# Patient Record
Sex: Male | Born: 1935
Health system: Southern US, Community
[De-identification: ages and names within clinical notes are randomized; demographics above are authoritative.]

## PROBLEM LIST (undated history)

## (undated) DIAGNOSIS — I10 Essential (primary) hypertension: Secondary | ICD-10-CM

## (undated) DIAGNOSIS — K5790 Diverticulosis of intestine, part unspecified, without perforation or abscess without bleeding: Secondary | ICD-10-CM

## (undated) DIAGNOSIS — K30 Functional dyspepsia: Secondary | ICD-10-CM

## (undated) DIAGNOSIS — Z720 Tobacco use: Secondary | ICD-10-CM

## (undated) DIAGNOSIS — E785 Hyperlipidemia, unspecified: Secondary | ICD-10-CM

## (undated) HISTORY — DX: Hyperlipidemia, unspecified: E78.5

## (undated) HISTORY — DX: Tobacco use: Z72.0

## (undated) HISTORY — DX: Essential (primary) hypertension: I10

## (undated) HISTORY — PX: VASECTOMY: SHX75

---

## 1998-09-06 HISTORY — PX: FRACTURE SURGERY: SHX138

## 1998-11-01 ENCOUNTER — Observation Stay (HOSPITAL_COMMUNITY): Admission: EM | Admit: 1998-11-01 | Discharge: 1998-11-01 | Payer: Self-pay | Admitting: Emergency Medicine

## 1998-11-01 ENCOUNTER — Encounter: Payer: Self-pay | Admitting: Emergency Medicine

## 1998-11-14 ENCOUNTER — Ambulatory Visit (HOSPITAL_COMMUNITY): Admission: RE | Admit: 1998-11-14 | Discharge: 1998-11-14 | Payer: Self-pay | Admitting: Urology

## 1998-11-14 ENCOUNTER — Encounter: Payer: Self-pay | Admitting: Urology

## 1998-12-13 ENCOUNTER — Inpatient Hospital Stay (HOSPITAL_COMMUNITY): Admission: EM | Admit: 1998-12-13 | Discharge: 1998-12-22 | Payer: Self-pay | Admitting: Emergency Medicine

## 1998-12-13 ENCOUNTER — Encounter: Payer: Self-pay | Admitting: Emergency Medicine

## 1998-12-13 ENCOUNTER — Encounter: Payer: Self-pay | Admitting: Orthopedic Surgery

## 1998-12-15 ENCOUNTER — Encounter: Payer: Self-pay | Admitting: Orthopedic Surgery

## 1998-12-16 ENCOUNTER — Encounter: Payer: Self-pay | Admitting: Orthopedic Surgery

## 1998-12-17 ENCOUNTER — Encounter: Payer: Self-pay | Admitting: General Surgery

## 1998-12-18 ENCOUNTER — Encounter: Payer: Self-pay | Admitting: Orthopedic Surgery

## 1998-12-22 ENCOUNTER — Inpatient Hospital Stay (HOSPITAL_COMMUNITY)
Admission: RE | Admit: 1998-12-22 | Discharge: 1998-12-31 | Payer: Self-pay | Admitting: Physical Medicine and Rehabilitation

## 1998-12-23 ENCOUNTER — Encounter: Payer: Self-pay | Admitting: Physical Medicine and Rehabilitation

## 1998-12-29 ENCOUNTER — Encounter: Payer: Self-pay | Admitting: Physical Medicine and Rehabilitation

## 1999-03-11 ENCOUNTER — Inpatient Hospital Stay (HOSPITAL_COMMUNITY): Admission: RE | Admit: 1999-03-11 | Discharge: 1999-03-12 | Payer: Self-pay | Admitting: Orthopedic Surgery

## 1999-04-07 ENCOUNTER — Encounter: Admission: RE | Admit: 1999-04-07 | Discharge: 1999-06-19 | Payer: Self-pay | Admitting: Orthopedic Surgery

## 2003-10-25 ENCOUNTER — Other Ambulatory Visit: Payer: Self-pay

## 2008-09-20 ENCOUNTER — Ambulatory Visit: Payer: Self-pay | Admitting: Internal Medicine

## 2008-11-26 ENCOUNTER — Ambulatory Visit: Payer: Self-pay | Admitting: Physician Assistant

## 2009-06-13 ENCOUNTER — Ambulatory Visit: Payer: Self-pay | Admitting: Gastroenterology

## 2009-12-08 ENCOUNTER — Ambulatory Visit: Payer: Self-pay | Admitting: General Practice

## 2009-12-24 ENCOUNTER — Inpatient Hospital Stay: Payer: Self-pay | Admitting: General Practice

## 2011-09-15 DIAGNOSIS — E785 Hyperlipidemia, unspecified: Secondary | ICD-10-CM | POA: Diagnosis not present

## 2011-09-15 DIAGNOSIS — Z79899 Other long term (current) drug therapy: Secondary | ICD-10-CM | POA: Diagnosis not present

## 2011-09-22 DIAGNOSIS — E785 Hyperlipidemia, unspecified: Secondary | ICD-10-CM | POA: Diagnosis not present

## 2011-09-22 DIAGNOSIS — Z79899 Other long term (current) drug therapy: Secondary | ICD-10-CM | POA: Diagnosis not present

## 2011-09-22 DIAGNOSIS — I498 Other specified cardiac arrhythmias: Secondary | ICD-10-CM | POA: Diagnosis not present

## 2011-09-22 DIAGNOSIS — I10 Essential (primary) hypertension: Secondary | ICD-10-CM | POA: Diagnosis not present

## 2011-12-20 DIAGNOSIS — J209 Acute bronchitis, unspecified: Secondary | ICD-10-CM | POA: Diagnosis not present

## 2011-12-20 DIAGNOSIS — J019 Acute sinusitis, unspecified: Secondary | ICD-10-CM | POA: Diagnosis not present

## 2011-12-20 DIAGNOSIS — I10 Essential (primary) hypertension: Secondary | ICD-10-CM | POA: Diagnosis not present

## 2012-02-02 ENCOUNTER — Telehealth: Payer: Self-pay | Admitting: Internal Medicine

## 2012-02-02 NOTE — Telephone Encounter (Signed)
Patient request return call to discuss [facility], she can be reached at [phone].

## 2012-02-03 NOTE — Telephone Encounter (Signed)
See note from 5/30

## 2012-03-16 DIAGNOSIS — Z125 Encounter for screening for malignant neoplasm of prostate: Secondary | ICD-10-CM | POA: Diagnosis not present

## 2012-03-16 DIAGNOSIS — E785 Hyperlipidemia, unspecified: Secondary | ICD-10-CM | POA: Diagnosis not present

## 2012-03-16 DIAGNOSIS — Z79899 Other long term (current) drug therapy: Secondary | ICD-10-CM | POA: Diagnosis not present

## 2012-03-30 DIAGNOSIS — Z79899 Other long term (current) drug therapy: Secondary | ICD-10-CM | POA: Diagnosis not present

## 2012-03-30 DIAGNOSIS — I10 Essential (primary) hypertension: Secondary | ICD-10-CM | POA: Diagnosis not present

## 2012-03-30 DIAGNOSIS — E785 Hyperlipidemia, unspecified: Secondary | ICD-10-CM | POA: Diagnosis not present

## 2012-04-06 DIAGNOSIS — H353190 Nonexudative age-related macular degeneration, unspecified eye, stage unspecified: Secondary | ICD-10-CM | POA: Diagnosis not present

## 2012-04-19 DIAGNOSIS — H353190 Nonexudative age-related macular degeneration, unspecified eye, stage unspecified: Secondary | ICD-10-CM | POA: Diagnosis not present

## 2012-07-09 DIAGNOSIS — Z23 Encounter for immunization: Secondary | ICD-10-CM | POA: Diagnosis not present

## 2012-07-19 DIAGNOSIS — M549 Dorsalgia, unspecified: Secondary | ICD-10-CM | POA: Diagnosis not present

## 2012-07-19 DIAGNOSIS — M545 Low back pain: Secondary | ICD-10-CM | POA: Diagnosis not present

## 2012-07-28 DIAGNOSIS — M545 Low back pain: Secondary | ICD-10-CM | POA: Diagnosis not present

## 2012-07-28 DIAGNOSIS — IMO0002 Reserved for concepts with insufficient information to code with codable children: Secondary | ICD-10-CM | POA: Diagnosis not present

## 2012-07-28 DIAGNOSIS — I10 Essential (primary) hypertension: Secondary | ICD-10-CM | POA: Diagnosis not present

## 2012-09-19 DIAGNOSIS — E785 Hyperlipidemia, unspecified: Secondary | ICD-10-CM | POA: Diagnosis not present

## 2012-09-19 DIAGNOSIS — Z79899 Other long term (current) drug therapy: Secondary | ICD-10-CM | POA: Diagnosis not present

## 2012-09-26 DIAGNOSIS — E785 Hyperlipidemia, unspecified: Secondary | ICD-10-CM | POA: Diagnosis not present

## 2012-09-26 DIAGNOSIS — I10 Essential (primary) hypertension: Secondary | ICD-10-CM | POA: Diagnosis not present

## 2012-09-26 DIAGNOSIS — Z0000 Encounter for general adult medical examination without abnormal findings: Secondary | ICD-10-CM | POA: Diagnosis not present

## 2012-09-26 DIAGNOSIS — K219 Gastro-esophageal reflux disease without esophagitis: Secondary | ICD-10-CM | POA: Diagnosis not present

## 2012-10-27 DIAGNOSIS — I498 Other specified cardiac arrhythmias: Secondary | ICD-10-CM | POA: Diagnosis not present

## 2012-11-14 DIAGNOSIS — R0602 Shortness of breath: Secondary | ICD-10-CM | POA: Diagnosis not present

## 2012-11-14 DIAGNOSIS — R011 Cardiac murmur, unspecified: Secondary | ICD-10-CM | POA: Diagnosis not present

## 2012-11-21 DIAGNOSIS — I4891 Unspecified atrial fibrillation: Secondary | ICD-10-CM | POA: Diagnosis not present

## 2012-11-21 DIAGNOSIS — I495 Sick sinus syndrome: Secondary | ICD-10-CM | POA: Diagnosis not present

## 2013-03-07 DIAGNOSIS — I10 Essential (primary) hypertension: Secondary | ICD-10-CM | POA: Diagnosis not present

## 2013-03-07 DIAGNOSIS — R5381 Other malaise: Secondary | ICD-10-CM | POA: Diagnosis not present

## 2013-03-07 DIAGNOSIS — I4891 Unspecified atrial fibrillation: Secondary | ICD-10-CM | POA: Diagnosis not present

## 2013-03-07 DIAGNOSIS — I495 Sick sinus syndrome: Secondary | ICD-10-CM | POA: Diagnosis not present

## 2013-03-07 DIAGNOSIS — R5383 Other fatigue: Secondary | ICD-10-CM | POA: Diagnosis not present

## 2013-03-19 DIAGNOSIS — Z125 Encounter for screening for malignant neoplasm of prostate: Secondary | ICD-10-CM | POA: Diagnosis not present

## 2013-03-19 DIAGNOSIS — I10 Essential (primary) hypertension: Secondary | ICD-10-CM | POA: Diagnosis not present

## 2013-03-19 DIAGNOSIS — Z0000 Encounter for general adult medical examination without abnormal findings: Secondary | ICD-10-CM | POA: Diagnosis not present

## 2013-04-05 DIAGNOSIS — E785 Hyperlipidemia, unspecified: Secondary | ICD-10-CM | POA: Diagnosis not present

## 2013-04-05 DIAGNOSIS — I4891 Unspecified atrial fibrillation: Secondary | ICD-10-CM | POA: Diagnosis not present

## 2013-04-05 DIAGNOSIS — I10 Essential (primary) hypertension: Secondary | ICD-10-CM | POA: Diagnosis not present

## 2013-04-05 DIAGNOSIS — I498 Other specified cardiac arrhythmias: Secondary | ICD-10-CM | POA: Diagnosis not present

## 2013-06-10 DIAGNOSIS — Z23 Encounter for immunization: Secondary | ICD-10-CM | POA: Diagnosis not present

## 2013-10-04 DIAGNOSIS — I10 Essential (primary) hypertension: Secondary | ICD-10-CM | POA: Diagnosis not present

## 2013-10-04 DIAGNOSIS — Z0000 Encounter for general adult medical examination without abnormal findings: Secondary | ICD-10-CM | POA: Diagnosis not present

## 2013-10-04 DIAGNOSIS — E785 Hyperlipidemia, unspecified: Secondary | ICD-10-CM | POA: Diagnosis not present

## 2013-11-28 ENCOUNTER — Encounter: Payer: Self-pay | Admitting: Family Medicine

## 2013-12-13 ENCOUNTER — Ambulatory Visit (INDEPENDENT_AMBULATORY_CARE_PROVIDER_SITE_OTHER): Payer: Medicare Other | Admitting: Family Medicine

## 2013-12-13 ENCOUNTER — Encounter: Payer: Self-pay | Admitting: Family Medicine

## 2013-12-13 VITALS — BP 130/60 | HR 60 | Temp 97.0°F | Resp 16 | Ht 65.5 in | Wt 180.0 lb

## 2013-12-13 DIAGNOSIS — I4949 Other premature depolarization: Secondary | ICD-10-CM | POA: Diagnosis not present

## 2013-12-13 DIAGNOSIS — E785 Hyperlipidemia, unspecified: Secondary | ICD-10-CM

## 2013-12-13 DIAGNOSIS — I10 Essential (primary) hypertension: Secondary | ICD-10-CM

## 2013-12-13 DIAGNOSIS — Z720 Tobacco use: Secondary | ICD-10-CM | POA: Insufficient documentation

## 2013-12-13 DIAGNOSIS — I493 Ventricular premature depolarization: Secondary | ICD-10-CM

## 2013-12-13 NOTE — Progress Notes (Signed)
Subjective:    Patient ID: Stephen Alvarez, male    DOB: 1932/06/02, 80 y.o.   MRN: [id number]  HPI Patient is a very pleasant 80 year old white male who is here today to establish care. History of hypertension as well as hyperlipidemia. He denies any chest pain, shortness of breath, dyspnea on exertion. He denies any myalgias or right upper quadrant pain. He had lab work checked 2 months ago his previous PCP. I do not have a record of that today to review but I like to get that. His last colonoscopy was about 3 or 4 years ago. He is overdue for prostate cancer screening. He states that he has never had a pneumonia vaccine. He denies any other medical problems. Past Medical History  Diagnosis Date  . Hyperlipidemia   . Hypertension   . Chewing tobacco use    Current Outpatient Prescriptions on File Prior to Visit  Medication Sig Dispense Refill  . aspirin 81 MG tablet Take 81 mg by mouth daily.      . hydrochlorothiazide (MICROZIDE) 12.5 MG capsule Take 12.5 mg by mouth daily.      Marland Kitchen omeprazole (PRILOSEC) 20 MG capsule Take 20 mg by mouth daily.      . simvastatin (ZOCOR) 40 MG tablet Take 40 mg by mouth daily.       No current facility-administered medications on file prior to visit.   No Known Allergies History   Social History  . Marital Status: Married    Spouse Name: N/A    Number of Children: N/A  . Years of Education: N/A   Occupational History  . Not on file.   Social History Main Topics  . Smoking status: Never Smoker   . Smokeless tobacco: Current User    Types: Chew  . Alcohol Use: No  . Drug Use: No  . Sexual Activity: No   Other Topics Concern  . Not on file   Social History Narrative  . No narrative on file   Family History  Problem Relation Age of Onset  . Depression Brother   . Diabetes Brother   . Heart disease Brother   . Hyperlipidemia Daughter   . Hypertension Daughter       Review of Systems  All other systems reviewed and are  negative.      Objective:   Physical Exam  Vitals reviewed. Constitutional: He is oriented to person, place, and time. He appears well-developed and well-nourished. No distress.  HENT:  Mouth/Throat: Oropharynx is clear and moist.  Neck: Neck supple. No thyromegaly present.  Cardiovascular: Normal rate, regular rhythm and normal heart sounds.  Exam reveals no gallop and no friction rub.   No murmur heard. Pulmonary/Chest: Effort normal and breath sounds normal. No respiratory distress. He has no wheezes. He has no rales. He exhibits no tenderness.  Abdominal: Soft. Bowel sounds are normal. He exhibits no distension and no mass. There is no tenderness. There is no rebound and no guarding.  Musculoskeletal: He exhibits no edema.  Neurological: He is alert and oriented to person, place, and time. He has normal reflexes. He displays normal reflexes. No cranial nerve deficit. He exhibits normal muscle tone. Coordination normal.  Skin: Rash noted. He is not diaphoretic.  Psychiatric: He has a normal mood and affect. His behavior is normal. Judgment and thought content normal.    Patient has several precancerous on his left cheek. There is also an ulcer on his lower left lip. He states that it is  only been there for one week he states that it is a healing fever blister.      Assessment & Plan:  1. PVC (premature ventricular contraction) I auscultated a PVC today on examination. I spent possibly 5-10 minutes discussing her PVCs are and when we should treat them at work them up further 2. HTN (hypertension) Pressures well controlled. Continue current medications at their present dosages. I did recommend cessation of chewing tobacco.  3. HLD (hyperlipidemia) I like to review the patient's most recent lab work. His go LDL cholesterol be less than 130.  Patient received Prevnar 13 today in office. I didn't Pneumovax 23 at his next office visit. I like to lab work to determine if he has had a  PSA in the last year. I also recommended that he monitor the spots on his face and lip. If they do not resolve I recommend biopsy.

## 2013-12-26 ENCOUNTER — Other Ambulatory Visit: Payer: Self-pay | Admitting: Family Medicine

## 2013-12-26 MED ORDER — OMEPRAZOLE 20 MG PO CPDR: 20.0000 mg | DELAYED_RELEASE_CAPSULE | Freq: Every day | ORAL | Status: DC

## 2014-06-03 ENCOUNTER — Telehealth: Payer: Self-pay | Admitting: Family Medicine

## 2014-06-03 MED ORDER — SIMVASTATIN 40 MG PO TABS: 40.0000 mg | ORAL_TABLET | Freq: Every day | ORAL | Status: DC

## 2014-06-03 NOTE — Telephone Encounter (Signed)
Pt is needing refills on simvastatin (ZOCOR) 40 MG tablet [phone]  [facility] [address] [place], [place] - [address]

## 2014-06-03 NOTE — Telephone Encounter (Signed)
Med sent to pharm. Pt scheduled for CPE on 06/13/14.

## 2014-06-13 ENCOUNTER — Encounter: Payer: Medicare Other | Admitting: Family Medicine

## 2014-06-26 DIAGNOSIS — Z23 Encounter for immunization: Secondary | ICD-10-CM | POA: Diagnosis not present

## 2014-06-27 DIAGNOSIS — Z86010 Personal history of colonic polyps: Secondary | ICD-10-CM | POA: Diagnosis not present

## 2014-07-02 ENCOUNTER — Encounter: Payer: Self-pay | Admitting: Family Medicine

## 2014-07-02 ENCOUNTER — Ambulatory Visit (INDEPENDENT_AMBULATORY_CARE_PROVIDER_SITE_OTHER): Payer: Medicare Other | Admitting: Family Medicine

## 2014-07-02 VITALS — BP 130/70 | HR 64 | Temp 98.2°F | Resp 16 | Ht 65.5 in | Wt 182.0 lb

## 2014-07-02 DIAGNOSIS — Z0000 Encounter for general adult medical examination without abnormal findings: Secondary | ICD-10-CM | POA: Diagnosis not present

## 2014-07-02 DIAGNOSIS — Z23 Encounter for immunization: Secondary | ICD-10-CM

## 2014-07-02 NOTE — Progress Notes (Signed)
**Note Stephen-Identified via Obfuscation** Subjective:    Patient ID: Stephen Alvarez, male    DOB: Dec 29, 1931, 80 y.o.   MRN: [id number]  HPI Patient is a very pleasant 80 year old white male who is here today for complete physical exam.  He has no medical concerns. He recently saw gastroenterologist and they have both decided that colonoscopies are no longer necessary for this patient. He would still like to have a prostate exam to evaluate for prostate cancer. I explained to the patient that it is highly likely that he has a slow-growing prostate cancer simply as a function of his age. I also explained that he would be better off not to treat a very slow-growing cancer. After this discussion, the patient still requests a rectal exam and a PSA. His flu shot is up-to-date. He is due for Pneumovax 23. He is also overdue for fasting lab work. Past Medical History  Diagnosis Date  . Hyperlipidemia   . Hypertension   . Chewing tobacco use    Past Surgical History  Procedure Laterality Date  . Fracture surgery  2000    Hip, shoulder, knee  . Vasectomy     Current Outpatient Prescriptions on File Prior to Visit  Medication Sig Dispense Refill  . aspirin 81 MG tablet Take 81 mg by mouth daily.      . hydrochlorothiazide (MICROZIDE) 12.5 MG capsule Take 12.5 mg by mouth daily.      Marland Kitchen. omeprazole (PRILOSEC) 20 MG capsule Take 1 capsule (20 mg total) by mouth daily.  90 capsule  4  . simvastatin (ZOCOR) 40 MG tablet Take 1 tablet (40 mg total) by mouth daily.  30 tablet  3   No current facility-administered medications on file prior to visit.   No Known Allergies History   Social History  . Marital Status: Married    Spouse Name: N/A    Number of Children: N/A  . Years of Education: N/A   Occupational History  . Not on file.   Social History Main Topics  . Smoking status: Never Smoker   . Smokeless tobacco: Current User    Types: Chew  . Alcohol Use: No  . Drug Use: No  . Sexual Activity: No   Other Topics Concern  .  Not on file   Social History Narrative  . No narrative on file   Family History  Problem Relation Age of Onset  . Depression Brother   . Diabetes Brother   . Heart disease Brother   . Hyperlipidemia Daughter   . Hypertension Daughter       Review of Systems  All other systems reviewed and are negative.      Objective:   Physical Exam  Vitals reviewed. Constitutional: He is oriented to person, place, and time. He appears well-developed and well-nourished. No distress.  HENT:  Head: Normocephalic and atraumatic.  Right Ear: External ear normal.  Left Ear: External ear normal.  Nose: Nose normal.  Mouth/Throat: Oropharynx is clear and moist. No oropharyngeal exudate.  Eyes: Conjunctivae and EOM are normal. Pupils are equal, round, and reactive to light. Right eye exhibits no discharge. Left eye exhibits no discharge. No scleral icterus.  Neck: Normal range of motion. Neck supple. No JVD present. No tracheal deviation present. No thyromegaly present.  Cardiovascular: Normal rate, regular rhythm, normal heart sounds and intact distal pulses.  Exam reveals no gallop and no friction rub.   No murmur heard. Pulmonary/Chest: Effort normal and breath sounds normal. No stridor. No respiratory distress.  He has no wheezes. He has no rales. He exhibits no tenderness.  Abdominal: Soft. Bowel sounds are normal. He exhibits no distension and no mass. There is no tenderness. There is no rebound and no guarding.  Genitourinary: Rectum normal, prostate normal and penis normal. Guaiac negative stool. No penile tenderness.  Musculoskeletal: Normal range of motion. He exhibits no edema and no tenderness.  Lymphadenopathy:    He has no cervical adenopathy.  Neurological: He is alert and oriented to person, place, and time. He has normal reflexes. He displays normal reflexes. No cranial nerve deficit. He exhibits normal muscle tone. Coordination normal.  Skin: Skin is warm. No rash noted. He is not  diaphoretic. No erythema. No pallor.  Psychiatric: He has a normal mood and affect. His behavior is normal. Judgment and thought content normal.          Assessment & Plan:  Routine general medical examination at a health care facility Routine general medical examination at a health care facility - Plan: COMPLETE METABOLIC PANEL WITH GFR, Lipid panel, CBC with Differential, PSA, Medicare   Patient's physical exam today is completely normal. I have asked him to return fasting for a CMP, lipid panel, CBC, and PSA. The remainder of his preventative care is up-to-date. The patient was given Pneumovax today. He declined a tetanus vaccine.

## 2014-07-12 ENCOUNTER — Other Ambulatory Visit: Payer: Medicare Other

## 2014-07-12 DIAGNOSIS — Z0000 Encounter for general adult medical examination without abnormal findings: Secondary | ICD-10-CM | POA: Diagnosis not present

## 2014-07-12 LAB — CBC WITH DIFFERENTIAL/PLATELET
BASOS ABS: 0.1 10*3/uL (ref 0.0–0.1)
BASOS PCT: 1 % (ref 0–1)
EOS ABS: 0.3 10*3/uL (ref 0.0–0.7)
Eosinophils Relative: 5 % (ref 0–5)
HCT: 38.1 % — ABNORMAL LOW (ref 39.0–52.0)
HEMOGLOBIN: 12.7 g/dL — AB (ref 13.0–17.0)
Lymphocytes Relative: 31 % (ref 12–46)
Lymphs Abs: 2 10*3/uL (ref 0.7–4.0)
MCH: 28.3 pg (ref 26.0–34.0)
MCHC: 33.3 g/dL (ref 30.0–36.0)
MCV: 85 fL (ref 78.0–100.0)
MONOS PCT: 12 % (ref 3–12)
Monocytes Absolute: 0.8 10*3/uL (ref 0.1–1.0)
NEUTROS ABS: 3.4 10*3/uL (ref 1.7–7.7)
Neutrophils Relative %: 51 % (ref 43–77)
Platelets: 256 10*3/uL (ref 150–400)
RBC: 4.48 MIL/uL (ref 4.22–5.81)
RDW: 13.7 % (ref 11.5–15.5)
WBC: 6.6 10*3/uL (ref 4.0–10.5)

## 2014-07-13 LAB — COMPLETE METABOLIC PANEL WITH GFR
ALT: 13 U/L (ref 0–53)
AST: 19 U/L (ref 0–37)
Albumin: 3.6 g/dL (ref 3.5–5.2)
Alkaline Phosphatase: 48 U/L (ref 39–117)
BILIRUBIN TOTAL: 0.5 mg/dL (ref 0.2–1.2)
BUN: 9 mg/dL (ref 6–23)
CALCIUM: 9.2 mg/dL (ref 8.4–10.5)
CO2: 29 mEq/L (ref 19–32)
CREATININE: 0.86 mg/dL (ref 0.50–1.35)
Chloride: 103 mEq/L (ref 96–112)
GFR, Est African American: >89 mL/min
GFR, Est Non African American: 82 mL/min
Glucose, Bld: 82 mg/dL (ref 70–99)
Potassium: 4.3 mEq/L (ref 3.5–5.3)
Sodium: 140 mEq/L (ref 135–145)
Total Protein: 6.7 g/dL (ref 6.0–8.3)

## 2014-07-13 LAB — LIPID PANEL
CHOLESTEROL: 151 mg/dL (ref 0–200)
HDL: 56 mg/dL (ref >39)
LDL CALC: 73 mg/dL (ref 0–99)
TRIGLYCERIDES: 111 mg/dL (ref <150)
Total CHOL/HDL Ratio: 2.7 Ratio
VLDL: 22 mg/dL (ref 0–40)

## 2014-07-13 LAB — PSA, MEDICARE: PSA: 1.68 ng/mL (ref <=4.00)

## 2014-07-17 ENCOUNTER — Encounter: Payer: Self-pay | Admitting: Family Medicine

## 2014-08-28 ENCOUNTER — Telehealth: Payer: Self-pay | Admitting: Family Medicine

## 2014-08-28 MED ORDER — HYDROCHLOROTHIAZIDE 12.5 MG PO CAPS: 12.5000 mg | ORAL_CAPSULE | Freq: Every day | ORAL | Status: DC

## 2014-08-28 NOTE — Telephone Encounter (Signed)
[facility] [address]  [phone] [id number] Patient is calling for refill on hydrochlorothiazide (MICROZIDE) 12.5 MG capsule

## 2014-08-28 NOTE — Telephone Encounter (Signed)
Med sent to pharm 

## 2014-09-30 ENCOUNTER — Other Ambulatory Visit: Payer: Self-pay | Admitting: Family Medicine

## 2014-11-19 DIAGNOSIS — H2513 Age-related nuclear cataract, bilateral: Secondary | ICD-10-CM | POA: Diagnosis not present

## 2014-12-16 DIAGNOSIS — H3532 Exudative age-related macular degeneration: Secondary | ICD-10-CM | POA: Diagnosis not present

## 2014-12-16 DIAGNOSIS — H2513 Age-related nuclear cataract, bilateral: Secondary | ICD-10-CM | POA: Diagnosis not present

## 2014-12-19 DIAGNOSIS — H3532 Exudative age-related macular degeneration: Secondary | ICD-10-CM | POA: Diagnosis not present

## 2014-12-24 DIAGNOSIS — H3532 Exudative age-related macular degeneration: Secondary | ICD-10-CM | POA: Diagnosis not present

## 2015-01-30 ENCOUNTER — Other Ambulatory Visit: Payer: Self-pay | Admitting: Family Medicine

## 2015-01-30 DIAGNOSIS — H3532 Exudative age-related macular degeneration: Secondary | ICD-10-CM | POA: Diagnosis not present

## 2015-03-03 ENCOUNTER — Other Ambulatory Visit: Payer: Self-pay | Admitting: Family Medicine

## 2015-03-06 DIAGNOSIS — H3532 Exudative age-related macular degeneration: Secondary | ICD-10-CM | POA: Diagnosis not present

## 2015-03-25 ENCOUNTER — Ambulatory Visit: Payer: Medicare Other | Admitting: Family Medicine

## 2015-03-31 ENCOUNTER — Other Ambulatory Visit: Payer: Self-pay | Admitting: Family Medicine

## 2015-03-31 ENCOUNTER — Encounter: Payer: Self-pay | Admitting: Family Medicine

## 2015-03-31 ENCOUNTER — Ambulatory Visit (INDEPENDENT_AMBULATORY_CARE_PROVIDER_SITE_OTHER): Payer: Medicare Other | Admitting: Family Medicine

## 2015-03-31 VITALS — BP 138/60 | HR 66 | Temp 98.3°F | Resp 18 | Ht 65.5 in | Wt 176.0 lb

## 2015-03-31 DIAGNOSIS — E785 Hyperlipidemia, unspecified: Secondary | ICD-10-CM | POA: Diagnosis not present

## 2015-03-31 DIAGNOSIS — Z23 Encounter for immunization: Secondary | ICD-10-CM

## 2015-03-31 DIAGNOSIS — K219 Gastro-esophageal reflux disease without esophagitis: Secondary | ICD-10-CM

## 2015-03-31 DIAGNOSIS — I10 Essential (primary) hypertension: Secondary | ICD-10-CM

## 2015-03-31 MED ORDER — SIMVASTATIN 40 MG PO TABS: ORAL_TABLET | ORAL | Status: DC

## 2015-03-31 NOTE — Addendum Note (Signed)
Addended by: Legrand Rams B on: 03/31/2015 02:19 PM   Modules accepted: Orders

## 2015-03-31 NOTE — Progress Notes (Signed)
   Subjective:    Patient ID: Stephen Alvarez, male    DOB: [birth year], 81 y.o.   MRN: [id number]  HPI Patient is here for recheck of his blood pressure. He is currently on hydrochlorothiazide 12.5 mg by mouth daily. His blood pressure is well controlled at 138/60. He denies any chest pain shortness of breath or dyspnea on exertion. He is also taking simvastatin 40 mg a day for hyperlipidemia. He denies any myalgias or right upper quadrant pain. He is overdue for fasting lab work. He is also due for Prevnar 13. PSA is not due until November. Given his age he is not a candidate for colonoscopy Past Medical History  Diagnosis Date  . Hyperlipidemia   . Hypertension   . Chewing tobacco use    Past Surgical History  Procedure Laterality Date  . Fracture surgery  2000    Hip, shoulder, knee  . Vasectomy     Current Outpatient Prescriptions on File Prior to Visit  Medication Sig Dispense Refill  . aspirin 81 MG tablet Take 81 mg by mouth daily.    . hydrochlorothiazide (MICROZIDE) 12.5 MG capsule Take 1 capsule (12.5 mg total) by mouth daily. 30 capsule 11  . omeprazole (PRILOSEC) 20 MG capsule Take 1 capsule (20 mg total) by mouth daily. 90 capsule 4  . Psyllium (METAMUCIL) WAFR Take by mouth.    . simvastatin (ZOCOR) 40 MG tablet TAKE ONE TABLET BY MOUTH ONCE DAILY *NEEDS OFFICE VISIT AND LABS BEFORE FURTHER REFILLS* 30 tablet 0   No current facility-administered medications on file prior to visit.   No Known Allergies History   Social History  . Marital Status: Married    Spouse Name: N/A  . Number of Children: N/A  . Years of Education: N/A   Occupational History  . Not on file.   Social History Main Topics  . Smoking status: Never Smoker   . Smokeless tobacco: Current User    Types: Chew  . Alcohol Use: No  . Drug Use: No  . Sexual Activity: No   Other Topics Concern  . Not on file   Social History Narrative      Review of Systems  All other systems reviewed and  are negative.      Objective:   Physical Exam  Constitutional: He is oriented to person, place, and time.  Cardiovascular: Normal rate, regular rhythm and normal heart sounds.   No murmur heard. Pulmonary/Chest: Effort normal and breath sounds normal. No respiratory distress. He has no wheezes. He has no rales.  Abdominal: Soft. Bowel sounds are normal. He exhibits no distension. There is no tenderness. There is no rebound and no guarding.  Musculoskeletal: He exhibits no edema.  Neurological: He is alert and oriented to person, place, and time. He has normal reflexes. He displays normal reflexes. No cranial nerve deficit. He exhibits normal muscle tone. Coordination normal.  Vitals reviewed. I can appreciate occasional PVCs        Assessment & Plan:  Hyperlipemia - Plan: COMPLETE METABOLIC PANEL WITH GFR, Lipid panel  Gastroesophageal reflux disease, esophagitis presence not specified  Benign essential HTN  Blood pressure is well controlled. I will make no changes in his blood pressure medication at this time. I have asked the patient to return fasting for a CMP and fasting lipid panel. Goal LDL cholesterol is less than 130

## 2015-04-02 ENCOUNTER — Other Ambulatory Visit: Payer: Medicare Other

## 2015-04-02 DIAGNOSIS — E785 Hyperlipidemia, unspecified: Secondary | ICD-10-CM | POA: Diagnosis not present

## 2015-04-02 LAB — COMPLETE METABOLIC PANEL WITH GFR
ALBUMIN: 3.8 g/dL (ref 3.6–5.1)
ALT: 14 U/L (ref 9–46)
AST: 21 U/L (ref 10–35)
Alkaline Phosphatase: 50 U/L (ref 40–115)
BUN: 10 mg/dL (ref 7–25)
CALCIUM: 9.2 mg/dL (ref 8.6–10.3)
CO2: 29 meq/L (ref 20–31)
CREATININE: 0.9 mg/dL (ref 0.70–1.11)
Chloride: 103 mEq/L (ref 98–110)
GFR, EST NON AFRICAN AMERICAN: 80 mL/min (ref >=60)
GLUCOSE: 89 mg/dL (ref 70–99)
Potassium: 4.2 mEq/L (ref 3.5–5.3)
Sodium: 143 mEq/L (ref 135–146)
Total Bilirubin: 0.5 mg/dL (ref 0.2–1.2)
Total Protein: 6.9 g/dL (ref 6.1–8.1)

## 2015-04-02 LAB — LIPID PANEL
CHOL/HDL RATIO: 2.9 ratio (ref <=5.0)
CHOLESTEROL: 152 mg/dL (ref 125–200)
HDL: 53 mg/dL (ref >=40)
LDL CALC: 76 mg/dL (ref <130)
Triglycerides: 117 mg/dL (ref <150)
VLDL: 23 mg/dL (ref <30)

## 2015-04-03 ENCOUNTER — Encounter: Payer: Self-pay | Admitting: Family Medicine

## 2015-05-01 DIAGNOSIS — H3532 Exudative age-related macular degeneration: Secondary | ICD-10-CM | POA: Diagnosis not present

## 2015-06-10 DIAGNOSIS — H353231 Exudative age-related macular degeneration, bilateral, with active choroidal neovascularization: Secondary | ICD-10-CM | POA: Diagnosis not present

## 2015-06-11 DIAGNOSIS — Z23 Encounter for immunization: Secondary | ICD-10-CM | POA: Diagnosis not present

## 2015-07-14 ENCOUNTER — Encounter: Payer: Self-pay | Admitting: Family Medicine

## 2015-07-14 ENCOUNTER — Ambulatory Visit (INDEPENDENT_AMBULATORY_CARE_PROVIDER_SITE_OTHER): Payer: Medicare Other | Admitting: Family Medicine

## 2015-07-14 VITALS — BP 120/80 | HR 68 | Temp 97.5°F | Resp 18 | Wt 181.0 lb

## 2015-07-14 DIAGNOSIS — H60391 Other infective otitis externa, right ear: Secondary | ICD-10-CM

## 2015-07-14 MED ORDER — AMOXICILLIN-POT CLAVULANATE 875-125 MG PO TABS: 1.0000 | ORAL_TABLET | Freq: Two times a day (BID) | ORAL | Status: DC

## 2015-07-14 MED ORDER — OXYCODONE-ACETAMINOPHEN 5-325 MG PO TABS: 1.0000 | ORAL_TABLET | Freq: Four times a day (QID) | ORAL | Status: DC | PRN

## 2015-07-14 NOTE — Progress Notes (Signed)
**Note Stephen-Identified via Obfuscation**    Subjective:    Patient ID: Stephen Alvarez, male    DOB: Dec 19, 1931, 81 y.o.   MRN: [id number]  HPI  Patient has a 2 day history of pain in his right external auditory canal. The skin at the auditory meatus is erythematous warm swollen and tender.  The tragus is eryhtematous and swollen and painful. Past Medical History  Diagnosis Date  . Hyperlipidemia   . Hypertension   . Chewing tobacco use    Past Surgical History  Procedure Laterality Date  . Fracture surgery  2000    Hip, shoulder, knee  . Vasectomy     Current Outpatient Prescriptions on File Prior to Visit  Medication Sig Dispense Refill  . aspirin 81 MG tablet Take 81 mg by mouth daily.    . hydrochlorothiazide (MICROZIDE) 12.5 MG capsule Take 1 capsule (12.5 mg total) by mouth daily. 30 capsule 11  . omeprazole (PRILOSEC) 20 MG capsule Take 1 capsule (20 mg total) by mouth daily. 90 capsule 4  . Psyllium (METAMUCIL) WAFR Take by mouth.    . simvastatin (ZOCOR) 40 MG tablet TAKE ONE TABLET BY MOUTH ONCE DAILY 90 tablet 1   No current facility-administered medications on file prior to visit.   No Known Allergies Social History   Social History  . Marital Status: Married    Spouse Name: N/A  . Number of Children: N/A  . Years of Education: N/A   Occupational History  . Not on file.   Social History Main Topics  . Smoking status: Never Smoker   . Smokeless tobacco: Current User    Types: Chew  . Alcohol Use: No  . Drug Use: No  . Sexual Activity: No   Other Topics Concern  . Not on file   Social History Narrative     Review of Systems  All other systems reviewed and are negative.      Objective:   Physical Exam  HENT:  Right Ear: Tympanic membrane normal.  Left Ear: Tympanic membrane normal. There is swelling and tenderness.  Cardiovascular: Normal rate, regular rhythm and normal heart sounds.   Pulmonary/Chest: Effort normal and breath sounds normal.          Assessment & Plan:    Otitis, externa, infective, right - Plan: amoxicillin-clavulanate (AUGMENTIN) 875-125 MG tablet, oxyCODONE-acetaminophen (ROXICET) 5-325 MG tablet  Begin Augmentin 875 mg by mouth twice a day for 10 days. Apply warm compresses. He can use pain medication as needed for pain. Monitor for signs of shingles. Recheck in 48 hours or sooner if worse

## 2015-07-15 DIAGNOSIS — H353231 Exudative age-related macular degeneration, bilateral, with active choroidal neovascularization: Secondary | ICD-10-CM | POA: Diagnosis not present

## 2015-08-19 DIAGNOSIS — H353211 Exudative age-related macular degeneration, right eye, with active choroidal neovascularization: Secondary | ICD-10-CM | POA: Diagnosis not present

## 2015-08-25 ENCOUNTER — Other Ambulatory Visit: Payer: Self-pay | Admitting: Family Medicine

## 2015-08-25 NOTE — Telephone Encounter (Signed)
Patient would like to have 90 day supply

## 2015-08-28 ENCOUNTER — Ambulatory Visit (INDEPENDENT_AMBULATORY_CARE_PROVIDER_SITE_OTHER): Payer: Medicare Other | Admitting: Family Medicine

## 2015-08-28 ENCOUNTER — Encounter: Payer: Self-pay | Admitting: Family Medicine

## 2015-08-28 VITALS — BP 100/58 | HR 78 | Temp 98.4°F | Resp 14 | Ht 65.5 in | Wt 182.0 lb

## 2015-08-28 DIAGNOSIS — Z01818 Encounter for other preprocedural examination: Secondary | ICD-10-CM

## 2015-08-28 DIAGNOSIS — E785 Hyperlipidemia, unspecified: Secondary | ICD-10-CM

## 2015-08-28 MED ORDER — HYDROCHLOROTHIAZIDE 12.5 MG PO CAPS: 12.5000 mg | ORAL_CAPSULE | Freq: Every day | ORAL | Status: AC

## 2015-08-28 NOTE — Progress Notes (Signed)
**Note Stephen-Identified via Obfuscation** Subjective:    Patient ID: Stephen Alvarez, male    DOB: [birth year], 81 y.o.   MRN: [id number]  HPI Patient is here for preoperative evaluation for cataract surgery.  He denies chest pain, angina, dyspnea on exertion, or orthopnea.  He denies edema or oliguria, or melena, or recent blood loss.  He is on aspirin.   Past Medical History  Diagnosis Date  . Hyperlipidemia   . Hypertension   . Chewing tobacco use    Past Surgical History  Procedure Laterality Date  . Fracture surgery  2000    Hip, shoulder, knee  . Vasectomy     Current Outpatient Prescriptions on File Prior to Visit  Medication Sig Dispense Refill  . aspirin 81 MG tablet Take 81 mg by mouth daily.    Marland Kitchen. FLUZONE HIGH-DOSE 0.5 ML SUSY TO BE ADMINISTERED BY PHARMACIST FOR IMMUNIZATION  0  . hydrochlorothiazide (MICROZIDE) 12.5 MG capsule TAKE ONE CAPSULE BY MOUTH ONCE DAILY 30 capsule 11  . omeprazole (PRILOSEC) 20 MG capsule Take 1 capsule (20 mg total) by mouth daily. 90 capsule 4  . oxyCODONE-acetaminophen (ROXICET) 5-325 MG tablet Take 1 tablet by mouth every 6 (six) hours as needed for severe pain. 10 tablet 0  . Psyllium (METAMUCIL) WAFR Take by mouth.    . simvastatin (ZOCOR) 40 MG tablet TAKE ONE TABLET BY MOUTH ONCE DAILY 90 tablet 1   No current facility-administered medications on file prior to visit.   No Known Allergies Social History   Social History  . Marital Status: Married    Spouse Name: N/A  . Number of Children: N/A  . Years of Education: N/A   Occupational History  . Not on file.   Social History Main Topics  . Smoking status: Never Smoker   . Smokeless tobacco: Current User    Types: Chew  . Alcohol Use: No  . Drug Use: No  . Sexual Activity: No   Other Topics Concern  . Not on file   Social History Narrative     Review of Systems  All other systems reviewed and are negative.      Objective:   Physical Exam  Constitutional: He is oriented to person, place, and time. He  appears well-developed and well-nourished.  Cardiovascular: Normal rate and regular rhythm.  Exam reveals gallop.   Pulmonary/Chest: Effort normal and breath sounds normal. No respiratory distress. He has no wheezes. He has no rales.  Abdominal: Soft. Bowel sounds are normal. He exhibits no distension. There is no tenderness. There is no rebound.  Musculoskeletal: He exhibits no edema.  Neurological: He is alert and oriented to person, place, and time. He has normal reflexes. He displays normal reflexes. No cranial nerve deficit. He exhibits normal muscle tone. Coordination normal.  Vitals reviewed.         Assessment & Plan:  Preop exam for internal medicine - Plan: CBC with Differential/Platelet, COMPLETE METABOLIC PANEL WITH GFR  HLD (hyperlipidemia) - Plan: CBC with Differential/Platelet, COMPLETE METABOLIC PANEL WITH GFR  Patient's physical exam is completely normal. He does not require any further risk stratification according to American Heart Association guidelines for low risk surgery. Therefore he is medically cleared to proceed with cataract surgery. He does have a perceptible gallop on physical exam which I believe may be mitral valve prolapse but again this would not prevent him from having surgery. I will obtain a CBC as well as a CMP. He will need to hold aspirin one week  prior to surgery.

## 2015-08-29 ENCOUNTER — Encounter: Payer: Self-pay | Admitting: Family Medicine

## 2015-08-29 LAB — COMPLETE METABOLIC PANEL WITH GFR
ALBUMIN: 3.9 g/dL (ref 3.6–5.1)
ALK PHOS: 54 U/L (ref 40–115)
ALT: 15 U/L (ref 9–46)
AST: 22 U/L (ref 10–35)
BILIRUBIN TOTAL: 0.4 mg/dL (ref 0.2–1.2)
BUN: 14 mg/dL (ref 7–25)
CO2: 28 mmol/L (ref 20–31)
CREATININE: 0.89 mg/dL (ref 0.70–1.11)
Calcium: 9.2 mg/dL (ref 8.6–10.3)
Chloride: 102 mmol/L (ref 98–110)
GFR, EST NON AFRICAN AMERICAN: 80 mL/min (ref >=60)
Glucose, Bld: 120 mg/dL — ABNORMAL HIGH (ref 70–99)
Potassium: 3.7 mmol/L (ref 3.5–5.3)
Sodium: 139 mmol/L (ref 135–146)
TOTAL PROTEIN: 7 g/dL (ref 6.1–8.1)

## 2015-08-29 LAB — CBC WITH DIFFERENTIAL/PLATELET
Basophils Absolute: 0.1 10*3/uL (ref 0.0–0.1)
Basophils Relative: 1 % (ref 0–1)
Eosinophils Absolute: 0.2 10*3/uL (ref 0.0–0.7)
Eosinophils Relative: 2 % (ref 0–5)
HEMATOCRIT: 38.8 % — AB (ref 39.0–52.0)
HEMOGLOBIN: 13.3 g/dL (ref 13.0–17.0)
LYMPHS ABS: 2.1 10*3/uL (ref 0.7–4.0)
LYMPHS PCT: 24 % (ref 12–46)
MCH: 29 pg (ref 26.0–34.0)
MCHC: 34.3 g/dL (ref 30.0–36.0)
MCV: 84.7 fL (ref 78.0–100.0)
MONOS PCT: 11 % (ref 3–12)
MPV: 10.3 fL (ref 8.6–12.4)
Monocytes Absolute: 0.9 10*3/uL (ref 0.1–1.0)
NEUTROS ABS: 5.3 10*3/uL (ref 1.7–7.7)
NEUTROS PCT: 62 % (ref 43–77)
Platelets: 251 10*3/uL (ref 150–400)
RBC: 4.58 MIL/uL (ref 4.22–5.81)
RDW: 13.6 % (ref 11.5–15.5)
WBC: 8.6 10*3/uL (ref 4.0–10.5)

## 2015-09-05 ENCOUNTER — Telehealth: Payer: Self-pay | Admitting: Family Medicine

## 2015-09-05 ENCOUNTER — Emergency Department (HOSPITAL_COMMUNITY)
Admission: EM | Admit: 2015-09-05 | Discharge: 2015-09-05 | Disposition: A | Discharge status: Home / Self Care | Payer: Medicare Other | Attending: Emergency Medicine | Admitting: Emergency Medicine

## 2015-09-05 ENCOUNTER — Emergency Department (HOSPITAL_COMMUNITY): Payer: Medicare Other

## 2015-09-05 DIAGNOSIS — R42 Dizziness and giddiness: Secondary | ICD-10-CM | POA: Diagnosis not present

## 2015-09-05 DIAGNOSIS — E785 Hyperlipidemia, unspecified: Secondary | ICD-10-CM | POA: Diagnosis not present

## 2015-09-05 DIAGNOSIS — I10 Essential (primary) hypertension: Secondary | ICD-10-CM | POA: Diagnosis not present

## 2015-09-05 DIAGNOSIS — R011 Cardiac murmur, unspecified: Secondary | ICD-10-CM | POA: Insufficient documentation

## 2015-09-05 DIAGNOSIS — R404 Transient alteration of awareness: Secondary | ICD-10-CM | POA: Diagnosis not present

## 2015-09-05 DIAGNOSIS — Z79899 Other long term (current) drug therapy: Secondary | ICD-10-CM | POA: Insufficient documentation

## 2015-09-05 DIAGNOSIS — R001 Bradycardia, unspecified: Secondary | ICD-10-CM | POA: Diagnosis not present

## 2015-09-05 DIAGNOSIS — R110 Nausea: Secondary | ICD-10-CM | POA: Insufficient documentation

## 2015-09-05 LAB — CBC
HCT: 38.9 % — ABNORMAL LOW (ref 39.0–52.0)
Hemoglobin: 12.6 g/dL — ABNORMAL LOW (ref 13.0–17.0)
MCH: 28.8 pg (ref 26.0–34.0)
MCHC: 32.4 g/dL (ref 30.0–36.0)
MCV: 88.8 fL (ref 78.0–100.0)
PLATELETS: 199 10*3/uL (ref 150–400)
RBC: 4.38 MIL/uL (ref 4.22–5.81)
RDW: 13.3 % (ref 11.5–15.5)
WBC: 8.4 10*3/uL (ref 4.0–10.5)

## 2015-09-05 LAB — BASIC METABOLIC PANEL
Anion gap: 11 (ref 5–15)
BUN: 10 mg/dL (ref 6–20)
CHLORIDE: 105 mmol/L (ref 101–111)
CO2: 25 mmol/L (ref 22–32)
CREATININE: 0.81 mg/dL (ref 0.61–1.24)
Calcium: 9.3 mg/dL (ref 8.9–10.3)
GFR calc Af Amer: >60 mL/min (ref >60)
GFR calc non Af Amer: >60 mL/min (ref >60)
Glucose, Bld: 102 mg/dL — ABNORMAL HIGH (ref 65–99)
Potassium: 3.9 mmol/L (ref 3.5–5.1)
SODIUM: 141 mmol/L (ref 135–145)

## 2015-09-05 LAB — I-STAT CHEM 8, ED
BUN: 13 mg/dL (ref 6–20)
CALCIUM ION: 1.16 mmol/L (ref 1.13–1.30)
Chloride: 103 mmol/L (ref 101–111)
Creatinine, Ser: 0.8 mg/dL (ref 0.61–1.24)
GLUCOSE: 101 mg/dL — AB (ref 65–99)
HCT: 45 % (ref 39.0–52.0)
HEMOGLOBIN: 15.3 g/dL (ref 13.0–17.0)
POTASSIUM: 4.4 mmol/L (ref 3.5–5.1)
Sodium: 142 mmol/L (ref 135–145)
TCO2: 31 mmol/L (ref 0–100)

## 2015-09-05 LAB — I-STAT TROPONIN, ED: TROPONIN I, POC: 0 ng/mL (ref 0.00–0.08)

## 2015-09-05 MED ORDER — MECLIZINE HCL 25 MG PO TABS: 25.0000 mg | ORAL_TABLET | Freq: Three times a day (TID) | ORAL | Status: DC | PRN

## 2015-09-05 MED ORDER — ONDANSETRON HCL 4 MG/2ML IJ SOLN
4.0000 mg | Freq: Once | INTRAMUSCULAR | Status: AC
  Administered 2015-09-05: 4 mg via INTRAVENOUS
  Filled 2015-09-05: qty 2

## 2015-09-05 MED ORDER — DIAZEPAM 5 MG/ML IJ SOLN
2.5000 mg | Freq: Once | INTRAMUSCULAR | Status: AC
  Administered 2015-09-05: 2.5 mg via INTRAVENOUS
  Filled 2015-09-05: qty 2

## 2015-09-05 NOTE — Discharge Instructions (Signed)
Epley Maneuver Self-Care °WHAT IS THE EPLEY MANEUVER? °The Epley maneuver is an exercise you can do to relieve symptoms of benign paroxysmal positional vertigo (BPPV). This condition is often just referred to as vertigo. BPPV is caused by the movement of tiny crystals (canaliths) inside your inner ear. The accumulation and movement of canaliths in your inner ear causes a sudden spinning sensation (vertigo) when you move your head to certain positions. Vertigo usually lasts about 30 seconds. BPPV usually occurs in just one ear. If you get vertigo when you lie on your left side, you probably have BPPV in your left ear. Your health care provider can tell you which ear is involved.  °BPPV may be caused by a head injury. Many people older than 50 get BPPV for unknown reasons. If you have been diagnosed with BPPV, your health care provider may teach you how to do this maneuver. BPPV is not life threatening (benign) and usually goes away in time.  °WHEN SHOULD I PERFORM THE EPLEY MANEUVER? °You can do this maneuver at home whenever you have symptoms of vertigo. You may do the Epley maneuver up to 3 times a day until your symptoms of vertigo go away. °HOW SHOULD I DO THE EPLEY MANEUVER? °· Sit on the edge of a bed or table with your back straight. Your legs should be extended or hanging over the edge of the bed or table.   °· Turn your head halfway toward the affected ear.   °· Lie backward quickly with your head turned until you are lying flat on your back. You may want to position a pillow under your shoulders.   °· Hold this position for 30 seconds. You may experience an attack of vertigo. This is normal. Hold this position until the vertigo stops. °· Then turn your head to the opposite direction until your unaffected ear is facing the floor.   °· Hold this position for 30 seconds. You may experience an attack of vertigo. This is normal. Hold this position until the vertigo stops. °· Now turn your whole body to the same  side as your head. Hold for another 30 seconds.   °· You can then sit back up. °ARE THERE RISKS TO THIS MANEUVER? °In some cases, you may have other symptoms (such as changes in your vision, weakness, or numbness). If you have these symptoms, stop doing the maneuver and call your health care provider. Even if doing these maneuvers relieves your vertigo, you may still have dizziness. Dizziness is the sensation of light-headedness but without the sensation of movement. Even though the Epley maneuver may relieve your vertigo, it is possible that your symptoms will return within 5 years. °WHAT SHOULD I DO AFTER THIS MANEUVER? °After doing the Epley maneuver, you can return to your normal activities. Ask your doctor if there is anything you should do at home to prevent vertigo. This may include: °· Sleeping with two or more pillows to keep your head elevated. °· Not sleeping on the side of your affected ear. °· Getting up slowly from bed. °· Avoiding sudden movements during the day. °· Avoiding extreme head movement, like looking up or bending over. °· Wearing a cervical collar to prevent sudden head movements. °WHAT SHOULD I DO IF MY SYMPTOMS GET WORSE? °Call your health care provider if your vertigo gets worse. Call your provider right way if you have other symptoms, including:  °· Nausea. °· Vomiting. °· Headache. °· Weakness. °· Numbness. °· Vision changes. °  °This information is not intended   to replace advice given to you by your health care provider. Make sure you discuss any questions you have with your health care provider.   Document Released: 08/28/2013 Document Reviewed: 08/28/2013 Elsevier Interactive Patient Education [employer]  Vertigo Vertigo means that you feel like you are moving when you are not. Vertigo can also make you feel like things around you are moving when they are not. This feeling can come and go at any time. Vertigo often goes away on its own. HOME CARE  Avoid making fast  movements.  Avoid driving.  Avoid using heavy machinery.  Avoid doing any task or activity that might cause danger to you or other people if you would have a vertigo attack while you are doing it.  Sit down right away if you feel dizzy or have trouble with your balance.  Take over-the-counter and prescription medicines only as told by your doctor.  Follow instructions from your doctor about which positions or movements you should avoid.  Drink enough fluid to keep your pee (urine) clear or pale yellow.  Keep all follow-up visits as told by your doctor. This is important. GET HELP IF:  Medicine does not help your vertigo.  You have a fever.  Your problems get worse or you have new symptoms.  Your family or friends see changes in your behavior.  You feel sick to your stomach (nauseous) or you throw up (vomit).  You have a "pins and needles" feeling or you are numb in part of your body. GET HELP RIGHT AWAY IF:  You have trouble moving or talking.  You are always dizzy.  You pass out (faint).  You get very bad headaches.  You feel weak or have trouble using your hands, arms, or legs.  You have changes in your hearing.  You have changes in your seeing (vision).  You get a stiff neck.  Bright light starts to bother you.   This information is not intended to replace advice given to you by your health care provider. Make sure you discuss any questions you have with your health care provider.   Document Released: 06/01/2008 Document Revised: 05/14/2015 Document Reviewed: 12/16/2014 Elsevier Interactive Patient Education 2016 [employer]  Vertigo Vertigo means you feel like you or your surroundings are moving when they are not. Vertigo can be dangerous if it occurs when you are at work, driving, or performing difficult activities.  CAUSES  Vertigo occurs when there is a conflict of signals sent to your brain from the visual and sensory systems in your body. There  are many different causes of vertigo, including:  Infections, especially in the inner ear.  A bad reaction to a drug or misuse of alcohol and medicines.  Withdrawal from drugs or alcohol.  Rapidly changing positions, such as lying down or rolling over in bed.  A migraine headache.  Decreased blood flow to the brain.  Increased pressure in the brain from a head injury, infection, tumor, or bleeding. SYMPTOMS  You may feel as though the world is spinning around or you are falling to the ground. Because your balance is upset, vertigo can cause nausea and vomiting. You may have involuntary eye movements (nystagmus). DIAGNOSIS  Vertigo is usually diagnosed by physical exam. If the cause of your vertigo is unknown, your caregiver may perform imaging tests, such as an MRI scan (magnetic resonance imaging). TREATMENT  Most cases of vertigo resolve on their own, without treatment. Depending on the cause, your caregiver may prescribe  certain medicines. If your vertigo is related to body position issues, your caregiver may recommend movements or procedures to correct the problem. In rare cases, if your vertigo is caused by certain inner ear problems, you may need surgery. HOME CARE INSTRUCTIONS   Follow your caregiver's instructions.  Avoid driving.  Avoid operating heavy machinery.  Avoid performing any tasks that would be dangerous to you or others during a vertigo episode.  Tell your caregiver if you notice that certain medicines seem to be causing your vertigo. Some of the medicines used to treat vertigo episodes can actually make them worse in some people. SEEK IMMEDIATE MEDICAL CARE IF:   Your medicines do not relieve your vertigo or are making it worse.  You develop problems with talking, walking, weakness, or using your arms, hands, or legs.  You develop severe headaches.  Your nausea or vomiting continues or gets worse.  You develop visual changes.  A family member notices  behavioral changes.  Your condition gets worse. MAKE SURE YOU:  Understand these instructions.  Will watch your condition.  Will get help right away if you are not doing well or get worse.   This information is not intended to replace advice given to you by your health care provider. Make sure you discuss any questions you have with your health care provider.   Document Released: 06/02/2005 Document Revised: 11/15/2011 Document Reviewed: 12/16/2014 Elsevier Interactive Patient Education [employer]

## 2015-09-05 NOTE — ED Provider Notes (Signed)
CSN: [id number]     Arrival date & time 09/05/15  1554 History   First MD Initiated Contact with Patient 09/05/15 1611     Chief Complaint  Patient presents with  . Dizziness     (Consider location/radiation/quality/duration/timing/severity/associated sxs/prior Treatment) Patient is a 80 y.o. male presenting with dizziness. The history is provided by the patient.  Dizziness Quality:  Room spinning Severity:  Severe Onset quality:  Gradual Duration:  1 day Timing:  Intermittent Progression:  Waxing and waning Chronicity:  New Context: head movement and standing up   Relieved by:  Being still, lying down and turning head Worsened by:  Sitting upright and turning head Ineffective treatments:  Lying down and change in position Associated symptoms: nausea   Associated symptoms: no blood in stool, no chest pain, no diarrhea, no headaches, no hearing loss, no palpitations, no shortness of breath, no syncope, no tinnitus, no vision changes, no vomiting and no weakness   Risk factors: no hx of stroke, no hx of vertigo, no multiple medications and no new medications     Past Medical History  Diagnosis Date  . Hyperlipidemia   . Hypertension   . Chewing tobacco use    Past Surgical History  Procedure Laterality Date  . Fracture surgery  2000    Hip, shoulder, knee  . Vasectomy     Family History  Problem Relation Age of Onset  . Depression Brother   . Diabetes Brother   . Heart disease Brother   . Hyperlipidemia Daughter   . Hypertension Daughter    Social History  Substance Use Topics  . Smoking status: Never Smoker   . Smokeless tobacco: Current User    Types: Chew  . Alcohol Use: No    Review of Systems  Constitutional: Negative for appetite change and fatigue.  HENT: Negative for hearing loss and tinnitus.   Eyes: Negative for pain.  Respiratory: Negative for shortness of breath.   Cardiovascular: Negative for chest pain, palpitations and syncope.   Gastrointestinal: Positive for nausea. Negative for vomiting, diarrhea and blood in stool.  Genitourinary: Negative for dysuria.  Musculoskeletal: Negative for back pain.  Skin: Negative for rash.  Neurological: Positive for dizziness. Negative for weakness and headaches.      Allergies  Review of patient's allergies indicates no known allergies.  Home Medications   Prior to Admission medications   Medication Sig Start Date End Date Taking? Authorizing Provider  hydrochlorothiazide (MICROZIDE) 12.5 MG capsule Take 1 capsule (12.5 mg total) by mouth daily. Patient taking differently: Take 12.5 mg by mouth at bedtime.  08/28/15  Yes Donita Brooks, MD  Multiple Vitamin (MULTIVITAMIN WITH MINERALS) TABS tablet Take 1 tablet by mouth daily.   Yes Historical Provider, MD  omeprazole (PRILOSEC) 20 MG capsule Take 1 capsule (20 mg total) by mouth daily. 12/26/13  Yes Donita Brooks, MD  simvastatin (ZOCOR) 40 MG tablet TAKE ONE TABLET BY MOUTH ONCE DAILY Patient taking differently: Take 40 mg by mouth daily at 6 PM.  03/31/15  Yes Donita Brooks, MD  meclizine (ANTIVERT) 25 MG tablet Take 1 tablet (25 mg total) by mouth 3 (three) times daily as needed for dizziness. 09/05/15   Stacy Gardner, MD   BP 149/83 mmHg  Pulse 56  Temp(Src) 98.2 F (36.8 C) (Oral)  Resp 17  SpO2 95% Physical Exam  Constitutional: He appears well-developed and well-nourished. No distress.  HENT:  Head: Normocephalic and atraumatic.  Right Ear: External ear normal.  Eyes: Conjunctivae and EOM are normal. Pupils are equal, round, and reactive to light.  Neck: Normal range of motion. Neck supple.  Cardiovascular: Regular rhythm and normal pulses.  Bradycardia present.  Exam reveals no decreased pulses.   Murmur (Pt had S3 murmur) heard. Pulmonary/Chest: Effort normal and breath sounds normal. No respiratory distress. He has no wheezes. He has no rales. He exhibits no tenderness.  Abdominal: Soft. Bowel  sounds are normal. He exhibits no distension and no mass. There is no tenderness. There is no rebound and no guarding.  Neurological: He is alert. He has normal strength. No sensory deficit. Coordination normal. GCS eye subscore is 4. GCS verbal subscore is 5. GCS motor subscore is 6.  Pt had normal finger to nose and heel to shin.  Negative HINTS exam.    Skin: Skin is warm and dry. No rash noted. He is not diaphoretic. No erythema.  Psychiatric: He has a normal mood and affect.    ED Course  Procedures (including critical care time) Labs Review Labs Reviewed  BASIC METABOLIC PANEL - Abnormal; Notable for the following:    Glucose, Bld 102 (*)    All other components within normal limits  CBC - Abnormal; Notable for the following:    Hemoglobin 12.6 (*)    HCT 38.9 (*)    All other components within normal limits  I-STAT CHEM 8, ED - Abnormal; Notable for the following:    Glucose, Bld 101 (*)    All other components within normal limits  I-STAT TROPOININ, ED  Rosezena SensorI-STAT TROPOININ, ED    Imaging Review Dg Chest 2 View  09/05/2015  CLINICAL DATA:  Dizziness, nausea today. EXAM: CHEST  2 VIEW COMPARISON:  12/25/2009 FINDINGS: Mild cardiomegaly. Large hiatal hernia. No confluent airspace opacities or effusions. No acute bony abnormality. IMPRESSION: Cardiomegaly.  Large hiatal hernia.  No active disease. Electronically Signed   By: Charlett NoseKevin  Dover M.D.   On: 09/05/2015 17:05   Ct Head Wo Contrast  09/05/2015  CLINICAL DATA:  81 year old male with dizziness, nausea and vomiting since waking up this morning. EXAM: CT HEAD WITHOUT CONTRAST TECHNIQUE: Contiguous axial images were obtained from the base of the skull through the vertex without intravenous contrast. COMPARISON:  Head CT 11/26/2008. FINDINGS: Well-defined low-attenuation lesion in the subinsular region of the left cerebral hemisphere (image 12 of series 201), new compared to the prior study, but compatible with an old lacunar infarct.  Patchy and confluent areas of decreased attenuation are noted throughout the deep and periventricular white matter of the cerebral hemispheres bilaterally, compatible with chronic microvascular ischemic disease. Mild cerebral atrophy. No acute intracranial abnormalities. Specifically, no evidence of acute intracranial hemorrhage, no definite findings of acute/subacute cerebral ischemia, no mass, mass effect, hydrocephalus or abnormal intra or extra-axial fluid collections. Visualized paranasal sinuses and mastoids are well pneumatized. No acute displaced skull fractures are identified. IMPRESSION: 1. No acute intracranial abnormalities. 2. Chronic microvascular ischemic changes, as above. 3. Mild cerebral atrophy. Electronically Signed   By: Trudie Reedaniel  Entrikin M.D.   On: 09/05/2015 17:20   I have personally reviewed and evaluated these images and lab results as part of my medical decision-making.   EKG Interpretation   Date/Time:  Friday September 05 2015 15:56:44 EST Ventricular Rate:  53 PR Interval:  261 QRS Duration: 105 QT Interval:  484 QTC Calculation: 454 R Axis:   49 Text Interpretation:  Sinus or ectopic atrial rhythm Prolonged PR interval  No old tracing to compare No  acute changes Confirmed by Rhunette Croft, MD,  Janey Genta [phone]) on 09/05/2015 4:21:29 PM      MDM   Final diagnoses:  Vertigo  Nausea    81 year old male who presents from home in setting of dizziness and nausea which started today. Patient reports dizziness began when he awoke today. He reports it was difficult for him to get out of bed due to dizziness. He reports symptoms occur when he sits up or moves head to the left. Due to continued symptoms he presented to the emergency department. With the symptoms he had significant nausea but no emesis. Patient denies any headaches, vision change, chest pain, shortness of breath, fever, rash, recent sick contacts, recent travel, recent medication change.  On arrival patient was  hemodynamically stable and afebrile. Patient resting comfortably when laying back in bed. When attempting to sit up patient became extremely nauseous. He reports "the room is spinning". Patient additionally had reoccurrence of symptoms when head turned to the left. Patient had normal finger to nose and heel to shin exam. Sensation intact in upper and lower extremities bilaterally. Patient with normal strength in bilateral upper and lower extremities. Patient had negative HINTS exam. However due to patient's age and comorbidities laboratory analysis and CT head performed. Additionally patient given Valium.  No significant electrolyte abnormalities or anemia noted. CT head did not reveal any acute intracranial abnormality. Troponin was negative. Patient reported significant improvement in symptoms after Valium. Patient able to ambulate without complications.  In setting of these findings this is likely peripheral vertigo. I have low suspicion for acute intracranial abnormality at this time. Will be discharged home with instructions on Epley maneuver as well as meclizine. Patient given number for ENT for follow-up as needed. Patient given strict return precautions and stable at time of discharge. Patient in agreement with plan.  Attending has seen and evaluated patient and Dr. Rhunette Croft is in agreement with plan.    Stacy Gardner, MD 09/05/15 [id number]  Derwood Kaplan, MD 09/05/15 2002

## 2015-09-05 NOTE — ED Notes (Signed)
[facility], per report pt started to have dizziness & nausea today with movement, denies CP, SOB, diarrhea, pt c/o nausea, pt had x1 emesis upon arrival to ED, pt A&O x4

## 2015-09-05 NOTE — ED Notes (Signed)
Pt ambulates without difficulty

## 2015-09-05 NOTE — Telephone Encounter (Signed)
Wife states patient VERY swimmy headed.  Can hardly sit up in the bed today without severe dizziness and nausea.  Has not been out of the bed.  Has only has few sips of liquids.  Denies any distress yesterday.  Given patient age and current distress, advised wife to call 911 and have EMS come to home and if need carry to ED.

## 2015-09-15 ENCOUNTER — Other Ambulatory Visit: Payer: Self-pay | Admitting: Family Medicine

## 2015-09-29 ENCOUNTER — Other Ambulatory Visit: Payer: Self-pay | Admitting: Family Medicine

## 2015-09-29 NOTE — Telephone Encounter (Signed)
Refill appropriate and filled per protocol. 

## 2015-09-30 DIAGNOSIS — H353211 Exudative age-related macular degeneration, right eye, with active choroidal neovascularization: Secondary | ICD-10-CM | POA: Diagnosis not present

## 2015-09-30 DIAGNOSIS — H353231 Exudative age-related macular degeneration, bilateral, with active choroidal neovascularization: Secondary | ICD-10-CM | POA: Diagnosis not present

## 2015-09-30 DIAGNOSIS — H2513 Age-related nuclear cataract, bilateral: Secondary | ICD-10-CM | POA: Diagnosis not present

## 2015-10-09 DIAGNOSIS — H2512 Age-related nuclear cataract, left eye: Secondary | ICD-10-CM | POA: Diagnosis not present

## 2015-10-30 DIAGNOSIS — Z0101 Encounter for examination of eyes and vision with abnormal findings: Secondary | ICD-10-CM | POA: Diagnosis not present

## 2015-11-26 DIAGNOSIS — H353231 Exudative age-related macular degeneration, bilateral, with active choroidal neovascularization: Secondary | ICD-10-CM | POA: Diagnosis not present

## 2015-12-08 DIAGNOSIS — Z79899 Other long term (current) drug therapy: Secondary | ICD-10-CM | POA: Diagnosis not present

## 2015-12-08 DIAGNOSIS — E7800 Pure hypercholesterolemia, unspecified: Secondary | ICD-10-CM | POA: Diagnosis not present

## 2015-12-08 DIAGNOSIS — Z125 Encounter for screening for malignant neoplasm of prostate: Secondary | ICD-10-CM | POA: Diagnosis not present

## 2015-12-08 DIAGNOSIS — K219 Gastro-esophageal reflux disease without esophagitis: Secondary | ICD-10-CM | POA: Diagnosis not present

## 2015-12-08 DIAGNOSIS — H8113 Benign paroxysmal vertigo, bilateral: Secondary | ICD-10-CM | POA: Diagnosis not present

## 2015-12-08 DIAGNOSIS — Z0000 Encounter for general adult medical examination without abnormal findings: Secondary | ICD-10-CM | POA: Diagnosis not present

## 2015-12-08 DIAGNOSIS — I10 Essential (primary) hypertension: Secondary | ICD-10-CM | POA: Diagnosis not present

## 2015-12-08 DIAGNOSIS — R011 Cardiac murmur, unspecified: Secondary | ICD-10-CM | POA: Diagnosis not present

## 2015-12-08 DIAGNOSIS — Z1211 Encounter for screening for malignant neoplasm of colon: Secondary | ICD-10-CM | POA: Diagnosis not present

## 2015-12-08 DIAGNOSIS — M25561 Pain in right knee: Secondary | ICD-10-CM | POA: Diagnosis not present

## 2015-12-08 DIAGNOSIS — M1990 Unspecified osteoarthritis, unspecified site: Secondary | ICD-10-CM | POA: Diagnosis not present

## 2015-12-08 DIAGNOSIS — R7309 Other abnormal glucose: Secondary | ICD-10-CM | POA: Diagnosis not present

## 2015-12-11 ENCOUNTER — Telehealth: Payer: Self-pay | Admitting: *Deleted

## 2015-12-11 NOTE — Telephone Encounter (Signed)
Submitted [facility] referral thru [facility] for authorization to Dr. Milton FergusonJeffrey Sparks,MD with authorization [id number]  Requesting provider: Milton FergusonJeffrey Sparks,MD  Treating provider: Milton FergusonJeffrey Sparks,MD  PCP: Frazier RichardsMary Beth Dixon, PA-C  Number of visits:1  Start Date: 12/08/15  End Date: 06/05/16  Dx: R01.1- Cardiac Murmur,Unspecified  Procedures: Tte W/Doppler Complete

## 2015-12-12 DIAGNOSIS — R7309 Other abnormal glucose: Secondary | ICD-10-CM | POA: Diagnosis not present

## 2015-12-12 DIAGNOSIS — I10 Essential (primary) hypertension: Secondary | ICD-10-CM | POA: Diagnosis not present

## 2015-12-12 DIAGNOSIS — Z79899 Other long term (current) drug therapy: Secondary | ICD-10-CM | POA: Diagnosis not present

## 2015-12-12 DIAGNOSIS — E7800 Pure hypercholesterolemia, unspecified: Secondary | ICD-10-CM | POA: Diagnosis not present

## 2015-12-12 DIAGNOSIS — Z125 Encounter for screening for malignant neoplasm of prostate: Secondary | ICD-10-CM | POA: Diagnosis not present

## 2015-12-16 DIAGNOSIS — R011 Cardiac murmur, unspecified: Secondary | ICD-10-CM | POA: Diagnosis not present

## 2015-12-18 DIAGNOSIS — W57XXXA Bitten or stung by nonvenomous insect and other nonvenomous arthropods, initial encounter: Secondary | ICD-10-CM | POA: Diagnosis not present

## 2015-12-18 DIAGNOSIS — L03113 Cellulitis of right upper limb: Secondary | ICD-10-CM | POA: Diagnosis not present

## 2015-12-18 DIAGNOSIS — S40861A Insect bite (nonvenomous) of right upper arm, initial encounter: Secondary | ICD-10-CM | POA: Diagnosis not present

## 2016-01-08 DIAGNOSIS — M25562 Pain in left knee: Secondary | ICD-10-CM | POA: Diagnosis not present

## 2016-01-08 DIAGNOSIS — M25561 Pain in right knee: Secondary | ICD-10-CM | POA: Diagnosis not present

## 2016-01-08 DIAGNOSIS — M1711 Unilateral primary osteoarthritis, right knee: Secondary | ICD-10-CM | POA: Diagnosis not present

## 2016-01-14 DIAGNOSIS — H353231 Exudative age-related macular degeneration, bilateral, with active choroidal neovascularization: Secondary | ICD-10-CM | POA: Diagnosis not present

## 2016-03-17 DIAGNOSIS — H40003 Preglaucoma, unspecified, bilateral: Secondary | ICD-10-CM | POA: Diagnosis not present

## 2016-03-17 DIAGNOSIS — H353231 Exudative age-related macular degeneration, bilateral, with active choroidal neovascularization: Secondary | ICD-10-CM | POA: Diagnosis not present

## 2016-05-12 DIAGNOSIS — H353231 Exudative age-related macular degeneration, bilateral, with active choroidal neovascularization: Secondary | ICD-10-CM | POA: Diagnosis not present

## 2016-05-12 DIAGNOSIS — H40003 Preglaucoma, unspecified, bilateral: Secondary | ICD-10-CM | POA: Diagnosis not present

## 2016-06-08 DIAGNOSIS — I10 Essential (primary) hypertension: Secondary | ICD-10-CM | POA: Diagnosis not present

## 2016-06-08 DIAGNOSIS — H8113 Benign paroxysmal vertigo, bilateral: Secondary | ICD-10-CM | POA: Diagnosis not present

## 2016-06-08 DIAGNOSIS — Z79899 Other long term (current) drug therapy: Secondary | ICD-10-CM | POA: Diagnosis not present

## 2016-06-08 DIAGNOSIS — E7800 Pure hypercholesterolemia, unspecified: Secondary | ICD-10-CM | POA: Diagnosis not present

## 2016-06-08 DIAGNOSIS — R7309 Other abnormal glucose: Secondary | ICD-10-CM | POA: Diagnosis not present

## 2016-06-08 DIAGNOSIS — M1990 Unspecified osteoarthritis, unspecified site: Secondary | ICD-10-CM | POA: Diagnosis not present

## 2016-06-16 DIAGNOSIS — H93299 Other abnormal auditory perceptions, unspecified ear: Secondary | ICD-10-CM | POA: Diagnosis not present

## 2016-06-16 DIAGNOSIS — H8112 Benign paroxysmal vertigo, left ear: Secondary | ICD-10-CM | POA: Diagnosis not present

## 2016-06-21 DIAGNOSIS — R42 Dizziness and giddiness: Secondary | ICD-10-CM | POA: Diagnosis not present

## 2016-06-25 DIAGNOSIS — H8112 Benign paroxysmal vertigo, left ear: Secondary | ICD-10-CM | POA: Diagnosis not present

## 2016-06-28 ENCOUNTER — Encounter: Payer: Self-pay | Admitting: Emergency Medicine

## 2016-06-28 ENCOUNTER — Emergency Department
Admission: EM | Admit: 2016-06-28 | Discharge: 2016-06-28 | Disposition: A | Discharge status: Home / Self Care | Payer: PPO | Attending: Emergency Medicine | Admitting: Emergency Medicine

## 2016-06-28 DIAGNOSIS — R42 Dizziness and giddiness: Secondary | ICD-10-CM | POA: Diagnosis not present

## 2016-06-28 DIAGNOSIS — Z79899 Other long term (current) drug therapy: Secondary | ICD-10-CM | POA: Diagnosis not present

## 2016-06-28 DIAGNOSIS — I10 Essential (primary) hypertension: Secondary | ICD-10-CM | POA: Insufficient documentation

## 2016-06-28 DIAGNOSIS — F17220 Nicotine dependence, chewing tobacco, uncomplicated: Secondary | ICD-10-CM | POA: Diagnosis not present

## 2016-06-28 DIAGNOSIS — R55 Syncope and collapse: Secondary | ICD-10-CM | POA: Insufficient documentation

## 2016-06-28 DIAGNOSIS — H9319 Tinnitus, unspecified ear: Secondary | ICD-10-CM | POA: Diagnosis not present

## 2016-06-28 LAB — URINALYSIS COMPLETE WITH MICROSCOPIC (ARMC ONLY)
BACTERIA UA: NONE SEEN
Bilirubin Urine: NEGATIVE
GLUCOSE, UA: NEGATIVE mg/dL
HGB URINE DIPSTICK: NEGATIVE
Ketones, ur: NEGATIVE mg/dL
Leukocytes, UA: NEGATIVE
NITRITE: NEGATIVE
Protein, ur: NEGATIVE mg/dL
RBC / HPF: NONE SEEN RBC/hpf (ref 0–5)
Specific Gravity, Urine: 1.008 (ref 1.005–1.030)
pH: 6 (ref 5.0–8.0)

## 2016-06-28 LAB — CBC
HEMATOCRIT: 41.8 % (ref 40.0–52.0)
HEMOGLOBIN: 14.4 g/dL (ref 13.0–18.0)
MCH: 29.9 pg (ref 26.0–34.0)
MCHC: 34.5 g/dL (ref 32.0–36.0)
MCV: 86.6 fL (ref 80.0–100.0)
Platelets: 226 10*3/uL (ref 150–440)
RBC: 4.83 MIL/uL (ref 4.40–5.90)
RDW: 13.2 % (ref 11.5–14.5)
WBC: 8.9 10*3/uL (ref 3.8–10.6)

## 2016-06-28 LAB — BASIC METABOLIC PANEL
ANION GAP: 8 (ref 5–15)
BUN: 15 mg/dL (ref 6–20)
CHLORIDE: 102 mmol/L (ref 101–111)
CO2: 29 mmol/L (ref 22–32)
Calcium: 9.7 mg/dL (ref 8.9–10.3)
Creatinine, Ser: 0.99 mg/dL (ref 0.61–1.24)
GFR calc Af Amer: >60 mL/min (ref >60)
GLUCOSE: 99 mg/dL (ref 65–99)
POTASSIUM: 4.1 mmol/L (ref 3.5–5.1)
Sodium: 139 mmol/L (ref 135–145)

## 2016-06-28 LAB — TROPONIN I

## 2016-06-28 NOTE — ED Provider Notes (Signed)
[facility] Emergency Department Provider Note  Time seen: 1:20 PM  I have reviewed the triage vital signs and the nursing notes.   HISTORY  Chief Complaint Near Syncope    HPI Stephen Alvarez is a 82 y.o. male with a past medical history of hypertension, hyperlipidemia presents to the emergency department with near syncope. According to the patient for the past 3 or 4 months he has been experiencing intermittent dizziness. He has been seen by ENT Dr. Willeen Cass, diagnosed as vertigo per patient. Patient states he was feeling dizzy today so he went to the walk-in clinic. Patient was evaluated and a prescription was called in for the patient. However during checkout the wife states the patient was looking up and became lightheaded, describing dizziness sensation. The wife had the patient sit down on the couch. Denies actually passing out or loss of consciousness. The patient states the nurse made him come to the emergency department for evaluation. Patient denies any chest pain or shortness of breath. Denies any nausea. Currently the patient states he feels normal. He states he has not eaten or drinking anything today.  Past Medical History:  Diagnosis Date  . Chewing tobacco use   . Hyperlipidemia   . Hypertension     Patient Active Problem List   Diagnosis Date Noted  . Hyperlipemia 03/31/2015  . GERD (gastroesophageal reflux disease) 03/31/2015  . Chewing tobacco use     Past Surgical History:  Procedure Laterality Date  . FRACTURE SURGERY  2000   Hip, shoulder, knee  . VASECTOMY      Prior to Admission medications   Medication Sig Start Date End Date Taking? Authorizing Provider  hydrochlorothiazide (MICROZIDE) 12.5 MG capsule Take 1 capsule (12.5 mg total) by mouth daily. Patient taking differently: Take 12.5 mg by mouth at bedtime.  08/28/15   Donita Brooks, MD  meclizine (ANTIVERT) 25 MG tablet Take 1 tablet (25 mg total) by mouth 3 (three) times  daily as needed for dizziness. 09/05/15   Stacy Gardner, MD  Multiple Vitamin (MULTIVITAMIN WITH MINERALS) TABS tablet Take 1 tablet by mouth daily.    Historical Provider, MD  omeprazole (PRILOSEC) 20 MG capsule TAKE ONE CAPSULE BY MOUTH ONCE DAILY 09/15/15   Donita Brooks, MD  simvastatin (ZOCOR) 40 MG tablet TAKE ONE TABLET BY MOUTH ONCE DAILY 09/29/15   Donita Brooks, MD    No Known Allergies  Family History  Problem Relation Age of Onset  . Depression Brother   . Diabetes Brother   . Heart disease Brother   . Hyperlipidemia Daughter   . Hypertension Daughter     Social History Social History  Substance Use Topics  . Smoking status: Never Smoker  . Smokeless tobacco: Current User    Types: Chew  . Alcohol use No    Review of Systems Constitutional: Negative for fever. Cardiovascular: Negative for chest pain. Respiratory: Negative for shortness of breath. Gastrointestinal: Negative for abdominal pain Musculoskeletal: Negative for back pain Neurological: Negative for headaches, focal weakness or numbness. 10-point ROS otherwise negative.  ____________________________________________   PHYSICAL EXAM:  VITAL SIGNS: ED Triage Vitals  Enc Vitals Group     BP 06/28/16 1020 (!) 176/86     Pulse Rate 06/28/16 1020 60     Resp 06/28/16 1020 16     Temp 06/28/16 1020 97.4 F (36.3 C)     Temp Source 06/28/16 1020 Oral     SpO2 06/28/16 1020 96 %  Weight 06/28/16 1017 180 lb (81.6 kg)     Height 06/28/16 1017 5\' 7"  (1.702 m)     Head Circumference --      Peak Flow --      Pain Score 06/28/16 1017 0     Pain Loc --      Pain Edu? --      Excl. in GC? --     Constitutional: Alert and oriented. Well appearing and in no distress. Eyes: Normal exam ENT   Head: Normocephalic and atraumatic. Normal tympanic membranes.   Mouth/Throat: Mucous membranes are moist. Cardiovascular: Normal rate, regular rhythm. No murmur Respiratory: Normal respiratory  effort without tachypnea nor retractions. Breath sounds are clear Gastrointestinal: Soft and nontender. No distention.  Musculoskeletal: Nontender with normal range of motion in all extremities. Neurologic:  Normal speech and language. No gross focal neurologic deficits Skin:  Skin is warm, dry and intact.  Psychiatric: Mood and affect are normal. Speech and behavior are normal.   ____________________________________________    EKG  EKG reviewed and interpreted by myself shows sinus bradycardia at 57 bpm, narrow QRS, normal axis, normal intervals besides slight PR prolongation., no ST changes. Normal EKG.  ____________________________________________   INITIAL IMPRESSION / ASSESSMENT AND PLAN / ED COURSE  Pertinent labs & imaging results that were available during my care of the patient were reviewed by me and considered in my medical decision making (see chart for details).  The patient presents to the emergency department for episode of near-syncope. Patient describes 4 months of intermittent dizziness with a ringing sensation in the left ear. States he has been told this is vertigo, with a ringing the patient could possibly be experiencing Mnire's disease. Patient states he became lightheaded and dizzy when he looked up, and this could either be an exacerbation of the patient's underlying vertigo, but could also be an exaggerated carotid sinus response. Regardless I believe the patient would benefit from a cardiology visit for further evaluation. They have added on cardiac enzymes to the patient's blood work. His BMP and CBC is within normal limits. EKG appears very reassuring. Patient is symptom-free. Patient states he has not eaten or drinking anything today. We will provide food and hydrate the patient while awaiting lab results. If labs are normal and patient continues to feel well we'll attempt ambulation in the emergency department. We'll have the patient follow-up with ENT as well  as cardiology.  Labs within normal limits. Troponin is negative. Urinalysis is normal. We will discharge the patient with cardiology follow-up. Patient has ambulated without any difficulty.  ____________________________________________   FINAL CLINICAL IMPRESSION(S) / ED DIAGNOSES  Near syncope    Minna AntisKevin Daleah Coulson, MD 06/28/16 [phone]

## 2016-06-28 NOTE — ED Triage Notes (Signed)
C/O dizziness for the past couple of months.  This morning had an episode of dizziness and weakness.  Spouse states episode occurred when looking up at the ceiling.  Patient currently feels ok, but feels dizzy when moving head back and forth.

## 2016-06-28 NOTE — ED Notes (Signed)
Pt alert and oriented X4, active, cooperative, pt in NAD. RR even and unlabored, color WNL.  Pt informed to return if any life threatening symptoms occur.   

## 2016-06-28 NOTE — ED Triage Notes (Signed)
Dr. Nicki Reaperooney has planned to have MRI of brain done and carotid US to work patient up for persistent dizziness.

## 2016-06-28 NOTE — ED Notes (Signed)
Pt felt dizzy earlier today when he lifted his head up while at the doctor's office. No LOC, no fall. Pt ambulated in ER. Ambulates independently but still feels dizzy while walking. States that it is worse when he lifts head and looks up.

## 2016-06-29 ENCOUNTER — Ambulatory Visit (INDEPENDENT_AMBULATORY_CARE_PROVIDER_SITE_OTHER): Payer: PPO | Admitting: Cardiology

## 2016-06-29 ENCOUNTER — Encounter: Payer: Self-pay | Admitting: Cardiology

## 2016-06-29 ENCOUNTER — Other Ambulatory Visit: Payer: Self-pay | Admitting: Physician Assistant

## 2016-06-29 VITALS — BP 146/81 | HR 63 | Ht 67.5 in | Wt 180.5 lb

## 2016-06-29 DIAGNOSIS — R55 Syncope and collapse: Secondary | ICD-10-CM

## 2016-06-29 DIAGNOSIS — I10 Essential (primary) hypertension: Secondary | ICD-10-CM

## 2016-06-29 DIAGNOSIS — I440 Atrioventricular block, first degree: Secondary | ICD-10-CM | POA: Diagnosis not present

## 2016-06-29 DIAGNOSIS — R42 Dizziness and giddiness: Secondary | ICD-10-CM

## 2016-06-29 DIAGNOSIS — E785 Hyperlipidemia, unspecified: Secondary | ICD-10-CM

## 2016-06-29 DIAGNOSIS — H9319 Tinnitus, unspecified ear: Secondary | ICD-10-CM

## 2016-06-29 NOTE — Progress Notes (Signed)
Cardiology Office Note   Date:  06/29/2016   ID:  Stephen Alvarez, DOB Nov 28, 1931, MRN [id number]  Referring Doctor:  Leo Grosser, MD   Cardiologist:   Almond Lint, MD   Reason for consultation:  Chief Complaint  Patient presents with  . Other    F/u ED syncope. Meds reviewed verbally with pt.      History of Present Illness: Stephen Alvarez is a 82 y.o. male who presents for Evaluation for dizziness.  Patient had episode of dizziness with sudden change in position, looking up and looking down. There was one episode while at a physician's office where this happened and was sent to the ER for further evaluation. The ER thought it was prudent to send him for cardiac evaluation.  He has been seen by ENT for similar symptoms. He is thought to have vertigo. Primary provider is actually sending him for further imaging with MRI and carotids due to no significant improvement with medications.  Patient denies chest pain and shortness of breath although limited ability to walk, walking at a slow pace.  Patient denies PND, orthopnea, edema. He does report fluttering in his chest with sensation of causing of his heart beat. Not having daily basis but may be every other night.   ROS:  Please see the history of present illness. Aside from mentioned under HPI, all other systems are reviewed and negative.     Past Medical History:  Diagnosis Date  . Chewing tobacco use   . Hyperlipidemia   . Hypertension     Past Surgical History:  Procedure Laterality Date  . FRACTURE SURGERY  2000   Hip, shoulder, knee  . VASECTOMY       reports that he has never smoked. His smokeless tobacco use includes Chew. He reports that he does not drink alcohol or use drugs.   family history includes Depression in his brother; Diabetes in his brother; Heart disease in his brother; Hyperlipidemia in his daughter; Hypertension in his daughter; Stroke in his father.   Outpatient Medications Prior  to Visit  Medication Sig Dispense Refill  . hydrochlorothiazide (MICROZIDE) 12.5 MG capsule Take 1 capsule (12.5 mg total) by mouth daily. (Patient taking differently: Take 12.5 mg by mouth at bedtime. ) 90 capsule 3  . meclizine (ANTIVERT) 25 MG tablet Take 1 tablet (25 mg total) by mouth 3 (three) times daily as needed for dizziness. 50 tablet 0  . Multiple Vitamin (MULTIVITAMIN WITH MINERALS) TABS tablet Take 1 tablet by mouth daily.    Marland Kitchen omeprazole (PRILOSEC) 20 MG capsule TAKE ONE CAPSULE BY MOUTH ONCE DAILY 90 capsule 3  . simvastatin (ZOCOR) 40 MG tablet TAKE ONE TABLET BY MOUTH ONCE DAILY 90 tablet 0   No facility-administered medications prior to visit.      Allergies: Review of patient's allergies indicates no known allergies.    PHYSICAL EXAM: VS:  BP (!) 146/81 (BP Location: Left Arm, Patient Position: Sitting, Cuff Size: Normal)   Pulse 63   Ht 5' 7.5" (1.715 m)   Wt 180 lb 8 oz (81.9 kg)   BMI 27.85 kg/m  , Body mass index is 27.85 kg/m. Wt Readings from Last 3 Encounters:  06/29/16 180 lb 8 oz (81.9 kg)  06/28/16 180 lb (81.6 kg)  08/28/15 182 lb (82.6 kg)    GENERAL:  well developed, well nourished, not in acute distress HEENT: normocephalic, pink conjunctivae, anicteric sclerae, no xanthelasma, normal dentition, oropharynx clear NECK:  no neck  vein engorgement, JVP normal, no hepatojugular reflux, carotid upstroke brisk and symmetric, no bruit, no thyromegaly, no lymphadenopathy LUNGS:  good respiratory effort, clear to auscultation bilaterally CV:  PMI not displaced, no thrills, no lifts, S1 and S2 within normal limits, no palpable S3 or S4, no murmurs, no rubs, no gallops ABD:  Soft, nontender, nondistended, normoactive bowel sounds, no abdominal aortic bruit, no hepatomegaly, no splenomegaly MS: nontender back, no kyphosis, no scoliosis, no joint deformities EXT:  2+ DP/PT pulses, no edema, no varicosities, no cyanosis, no clubbing SKIN: warm, nondiaphoretic,  normal turgor, no ulcers NEUROPSYCH: alert, oriented to person, place, and time, sensory/motor grossly intact, normal mood, appropriate affect  Recent Labs: 08/28/2015: ALT 15 06/28/2016: BUN 15; Creatinine, Ser 0.99; Hemoglobin 14.4; Platelets 226; Potassium 4.1; Sodium 139   Lipid Panel    Component Value Date/Time   CHOL 152 04/02/2015 0844   TRIG 117 04/02/2015 0844   HDL 53 04/02/2015 0844   CHOLHDL 2.9 04/02/2015 0844   VLDL 23 04/02/2015 0844   LDLCALC 76 04/02/2015 0844     Other studies Reviewed:  EKG:  The ekg from 06/29/2016 was personally reviewed by me and it revealed sinus rhythm, 63 BPM, first-degree AV block  Additional studies/ records that were reviewed personally reviewed by me today include: None available   ASSESSMENT AND PLAN:  Episode of lightheadedness or syncope, positional in nature Likely related to vertigo   First-degree AV block/abnormal EKG Palpitations  Risk factors for CAD include age, gender, hypertension, hyperlipidemia Recommend further evaluation with echocardiogram, pharmacologic nuclear stress test Recommend 48 hour Holter monitor.  Hypertension BP is well controlled. Continue monitoring BP. Continue current medical therapy and lifestyle changes.  Hyperlipidemia PCP following labs.   Current medicines are reviewed at length with the patient today.  The patient does not have concerns regarding medicines.  Labs/ tests ordered today include:  Orders Placed This Encounter  Procedures  . NM Myocar Multi W/Spect W/Wall Motion / EF  . Holter monitor - 24 hour  . EKG 12-Lead  . ECHOCARDIOGRAM COMPLETE    I had a lengthy and detailed discussion with the patient regarding diagnoses, prognosis, diagnostic options, treatment options , and side effects of medications.   I counseled the patient on importance of lifestyle modification including heart healthy diet, regular physical activity Once cardiac workup is  completed.   Disposition:   FU with undersigned after tests   I spent at least 45 minutes with the patient today and more than 50% of the time was spent counseling the patient and coordinating care.     Signed, Almond LintAileen Keyontae Huckeby, MD  06/29/2016 3:27 PM    [facility]  This note was generated in part with voice recognition software and I apologize for any typographical errors that were not detected and corrected.

## 2016-06-29 NOTE — Patient Instructions (Addendum)
Testing/Procedures: Your physician has requested that you have an echocardiogram. Echocardiography is a painless test that uses sound waves to create images of your heart. It provides your doctor with information about the size and shape of your heart and how well your heart's chambers and valves are working. This procedure takes approximately one hour. There are no restrictions for this procedure.  Your physician has recommended that you wear a holter monitor. Holter monitors are medical devices that record the heart's electrical activity. Doctors most often use these monitors to diagnose arrhythmias. Arrhythmias are problems with the speed or rhythm of the heartbeat. The monitor is a small, portable device. You can wear one while you do your normal daily activities. This is usually used to diagnose what is causing palpitations/syncope (passing out).  [facility] MYOVIEW  Your caregiver has ordered a Stress Test with nuclear imaging. The purpose of this test is to evaluate the blood supply to your heart muscle. This procedure is referred to as a "Non-Invasive Stress Test." This is because other than having an IV started in your vein, nothing is inserted or "invades" your body. Cardiac stress tests are done to find areas of poor blood flow to the heart by determining the extent of coronary artery disease (CAD). Some patients exercise on a treadmill, which naturally increases the blood flow to your heart, while others who are  unable to walk on a treadmill due to physical limitations have a pharmacologic/chemical stress agent called Lexiscan . This medicine will mimic walking on a treadmill by temporarily increasing your coronary blood flow.   Please note: these test may take anywhere between 2-4 hours to complete  PLEASE REPORT TO [facility] MEDICAL MALL ENTRANCE  THE VOLUNTEERS AT THE FIRST DESK WILL DIRECT YOU WHERE TO GO  Date of Procedure:_Friday July 09, 2016 at 08:30AM_  Arrival Time for  Procedure:_Arrive at 08:15AM to register__    PLEASE NOTIFY THE OFFICE AT LEAST 24 HOURS IN ADVANCE IF YOU ARE UNABLE TO KEEP YOUR APPOINTMENT.  [phone] AND  PLEASE NOTIFY NUCLEAR MEDICINE AT [facility] AT LEAST 24 HOURS IN ADVANCE IF YOU ARE UNABLE TO KEEP YOUR APPOINTMENT. [phone]  How to prepare for your Myoview test:  1. Do not eat or drink after midnight 2. No caffeine for 24 hours prior to test 3. No smoking 24 hours prior to test. 4. Your medication may be taken with water.  If your doctor stopped a medication because of this test, do not take that medication. 5. Ladies, please do not wear dresses.  Skirts or pants are appropriate. Please wear a short sleeve shirt. 6. No perfume, cologne or lotion. 7. Wear comfortable walking shoes. No heels!   Follow-Up: Your physician recommends that you schedule a follow-up appointment as needed. We will call you with results of testing.   It was a pleasure seeing you today here in the office. Please do not hesitate to give [place] a call back if you have any further questions. [phone]   Cellar RN, BSN     Echocardiogram An echocardiogram, or echocardiography, uses sound waves (ultrasound) to produce an image of your heart. The echocardiogram is simple, painless, obtained within a short period of time, and offers valuable information to your health care provider. The images from an echocardiogram can provide information such as:  Evidence of coronary artery disease (CAD).  Heart size.  Heart muscle function.  Heart valve function.  Aneurysm detection.  Evidence of a past heart attack.  Fluid buildup around the  heart.  Heart muscle thickening.  Assess heart valve function. LET [facility] CARE PROVIDER KNOW ABOUT:  Any allergies you have.  All medicines you are taking, including vitamins, herbs, eye drops, creams, and over-the-counter medicines.  Previous problems you or members of your family have had with the  use of anesthetics.  Any blood disorders you have.  Previous surgeries you have had.  Medical conditions you have.  Possibility of pregnancy, if this applies. BEFORE THE PROCEDURE  No special preparation is needed. Eat and drink normally.  PROCEDURE   In order to produce an image of your heart, gel will be applied to your chest and a wand-like tool (transducer) will be moved over your chest. The gel will help transmit the sound waves from the transducer. The sound waves will harmlessly bounce off your heart to allow the heart images to be captured in real-time motion. These images will then be recorded.  You may need an IV to receive a medicine that improves the quality of the pictures. AFTER THE PROCEDURE You may return to your normal schedule including diet, activities, and medicines, unless your health care provider tells you otherwise.   This information is not intended to replace advice given to you by your health care provider. Make sure you discuss any questions you have with your health care provider.   Document Released: 08/20/2000 Document Revised: 09/13/2014 Document Reviewed: 04/30/2013 Elsevier Interactive Patient Education 2016 Elsevier Inc.  Cardiac Event Monitoring A cardiac event monitor is a small recording device used to help detect abnormal heart rhythms (arrhythmias). The monitor is used to record heart rhythm when noticeable symptoms such as the following occur:  Fast heartbeats (palpitations), such as heart racing or fluttering.  Dizziness.  Fainting or light-headedness.  Unexplained weakness. The monitor is wired to two electrodes placed on your chest. Electrodes are flat, sticky disks that attach to your skin. The monitor can be worn for up to 30 days. You will wear the monitor at all times, except when bathing.  HOW TO USE YOUR CARDIAC EVENT MONITOR A technician will prepare your chest for the electrode placement. The technician will show you how to  place the electrodes, how to work the monitor, and how to replace the batteries. Take time to practice using the monitor before you leave the office. Make sure you understand how to send the information from the monitor to your health care provider. This requires a telephone with a landline, not a cell phone. You need to:  Wear your monitor at all times, except when you are in water:  Do not get the monitor wet.  Take the monitor off when bathing. Do not swim or use a hot tub with it on.  Keep your skin clean. Do not put body lotion or moisturizer on your chest.  Change the electrodes daily or any time they stop sticking to your skin. You might need to use tape to keep them on.  It is possible that your skin under the electrodes could become irritated. To keep this from happening, try to put the electrodes in slightly different places on your chest. However, they must remain in the area under your left breast and in the upper right section of your chest.  Make sure the monitor is safely clipped to your clothing or in a location close to your body that your health care provider recommends.  Press the button to record when you feel symptoms of heart trouble, such as dizziness, weakness, light-headedness, palpitations,  thumping, shortness of breath, unexplained weakness, or a fluttering or racing heart. The monitor is always on and records what happened slightly before you pressed the button, so do not worry about being too late to get good information.  Keep a diary of your activities, such as walking, doing chores, and taking medicine. It is especially important to note what you were doing when you pushed the button to record your symptoms. This will help your health care provider determine what might be contributing to your symptoms. The information stored in your monitor will be reviewed by your health care provider alongside your diary entries.  Send the recorded information as recommended by  your health care provider. It is important to understand that it will take some time for your health care provider to process the results.  Change the batteries as recommended by your health care provider. SEEK IMMEDIATE MEDICAL CARE IF:   You have chest pain.  You have extreme difficulty breathing or shortness of breath.  You develop a very fast heartbeat that persists.  You develop dizziness that does not go away.  You faint or constantly feel you are about to faint.   This information is not intended to replace advice given to you by your health care provider. Make sure you discuss any questions you have with your health care provider.   Document Released: 06/01/2008 Document Revised: 09/13/2014 Document Reviewed: 02/19/2013 Elsevier Interactive Patient Education 2016 [employer]. Pharmacologic Stress Electrocardiogram A pharmacologic stress electrocardiogram is a heart (cardiac) test that uses nuclear imaging to evaluate the blood supply to your heart. This test may also be called a pharmacologic stress electrocardiography. Pharmacologic means that a medicine is used to increase your heart rate and blood pressure.  This stress test is done to find areas of poor blood flow to the heart by determining the extent of coronary artery disease (CAD). Some people exercise on a treadmill, which naturally increases the blood flow to the heart. For those people unable to exercise on a treadmill, a medicine is used. This medicine stimulates your heart and will cause your heart to beat harder and more quickly, as if you were exercising.  Pharmacologic stress tests can help determine:  The adequacy of blood flow to your heart during increased levels of activity in order to clear you for discharge home.  The extent of coronary artery blockage caused by CAD.  Your prognosis if you have suffered a heart attack.  The effectiveness of cardiac procedures done, such as an angioplasty, which can  increase the circulation in your coronary arteries.  Causes of chest pain or pressure. LET [facility] CARE PROVIDER KNOW ABOUT:  Any allergies you have.  All medicines you are taking, including vitamins, herbs, eye drops, creams, and over-the-counter medicines.  Previous problems you or members of your family have had with the use of anesthetics.  Any blood disorders you have.  Previous surgeries you have had.  Medical conditions you have.  Possibility of pregnancy, if this applies.  If you are currently breastfeeding. RISKS AND COMPLICATIONS Generally, this is a safe procedure. However, as with any procedure, complications can occur. Possible complications include:  You develop pain or pressure in the following areas:  Chest.  Jaw or neck.  Between your shoulder blades.  Radiating down your left arm.  Headache.  Dizziness or light-headedness.  Shortness of breath.  Increased or irregular heartbeat.  Low blood pressure.  Nausea or vomiting.  Flushing.  Redness going up the arm  and slight pain during injection of medicine.  Heart attack (rare). BEFORE THE PROCEDURE   Avoid all forms of caffeine for 24 hours before your test or as directed by your health care provider. This includes coffee, tea (even decaffeinated tea), caffeinated sodas, chocolate, cocoa, and certain pain medicines.  Follow your health care provider's instructions regarding eating and drinking before the test.  Take your medicines as directed at regular times with water unless instructed otherwise. Exceptions may include:  If you have diabetes, ask how you are to take your insulin or pills. It is common to adjust insulin dosing the morning of the test.  If you are taking beta-blocker medicines, it is important to talk to your health care provider about these medicines well before the date of your test. Taking beta-blocker medicines may interfere with the test. In some cases, these medicines  need to be changed or stopped 24 hours or more before the test.  If you wear a nitroglycerin patch, it may need to be removed prior to the test. Ask your health care provider if the patch should be removed before the test.  If you use an inhaler for any breathing condition, bring it with you to the test.  If you are an outpatient, bring a snack so you can eat right after the stress phase of the test.  Do not smoke for 4 hours prior to the test or as directed by your health care provider.  Do not apply lotions, powders, creams, or oils on your chest prior to the test.  Wear comfortable shoes and clothing. Let your health care provider know if you were unable to complete or follow the preparations for your test. PROCEDURE   Multiple patches (electrodes) will be put on your chest. If needed, small areas of your chest may be shaved to get better contact with the electrodes. Once the electrodes are attached to your body, multiple wires will be attached to the electrodes, and your heart rate will be monitored.  An IV access will be started. A nuclear trace (isotope) is given. The isotope may be given intravenously, or it may be swallowed. Nuclear refers to several types of radioactive isotopes, and the nuclear isotope lights up the arteries so that the nuclear images are clear. The isotope is absorbed by your body. This results in low radiation exposure.  A resting nuclear image is taken to show how your heart functions at rest.  A medicine is given through the IV access.  A second scan is done about 1 hour after the medicine injection and determines how your heart functions under stress.  During this stress phase, you will be connected to an electrocardiogram machine. Your blood pressure and oxygen levels will be monitored. AFTER THE PROCEDURE   Your heart rate and blood pressure will be monitored after the test.  You may return to your normal schedule, including diet,activities, and  medicines, unless your health care provider tells you otherwise.   This information is not intended to replace advice given to you by your health care provider. Make sure you discuss any questions you have with your health care provider.   Document Released: 01/09/2009 Document Revised: 08/28/2013 Document Reviewed: 04/30/2013 Elsevier Interactive Patient Education [employer] Elsevier Inc.

## 2016-07-02 DIAGNOSIS — H8113 Benign paroxysmal vertigo, bilateral: Secondary | ICD-10-CM | POA: Diagnosis not present

## 2016-07-05 DIAGNOSIS — H8112 Benign paroxysmal vertigo, left ear: Secondary | ICD-10-CM | POA: Diagnosis not present

## 2016-07-08 DIAGNOSIS — H353231 Exudative age-related macular degeneration, bilateral, with active choroidal neovascularization: Secondary | ICD-10-CM | POA: Diagnosis not present

## 2016-07-09 ENCOUNTER — Encounter
Admission: RE | Admit: 2016-07-09 | Discharge: 2016-07-09 | Disposition: A | Discharge status: Home / Self Care | Payer: PPO | Source: Ambulatory Visit | Attending: Cardiology | Admitting: Cardiology

## 2016-07-09 DIAGNOSIS — R55 Syncope and collapse: Secondary | ICD-10-CM | POA: Insufficient documentation

## 2016-07-09 DIAGNOSIS — R42 Dizziness and giddiness: Secondary | ICD-10-CM | POA: Insufficient documentation

## 2016-07-12 ENCOUNTER — Ambulatory Visit
Admission: RE | Admit: 2016-07-12 | Discharge: 2016-07-12 | Disposition: A | Discharge status: Home / Self Care | Payer: PPO | Source: Ambulatory Visit | Attending: Cardiology | Admitting: Cardiology

## 2016-07-12 DIAGNOSIS — R42 Dizziness and giddiness: Secondary | ICD-10-CM | POA: Insufficient documentation

## 2016-07-12 DIAGNOSIS — R55 Syncope and collapse: Secondary | ICD-10-CM | POA: Insufficient documentation

## 2016-07-12 LAB — NM MYOCAR MULTI W/SPECT W/WALL MOTION / EF
CHL CUP MPHR: 138 {beats}/min
CHL CUP NUCLEAR SDS: 0
CHL CUP RESTING HR STRESS: 61 {beats}/min
CSEPEW: 1 METS
CSEPPHR: 85 {beats}/min
Exercise duration (min): 0 min
Exercise duration (sec): 0 s
LV dias vol: 57 mL (ref 62–150)
LV sys vol: 11 mL
NUC STRESS TID: 1
Percent HR: 61 %
SRS: 0
SSS: 0

## 2016-07-12 MED ORDER — TECHNETIUM TC 99M TETROFOSMIN IV KIT
30.0000 | PACK | Freq: Once | INTRAVENOUS | Status: AC | PRN
  Administered 2016-07-12: 31.86 via INTRAVENOUS

## 2016-07-12 MED ORDER — TECHNETIUM TC 99M TETROFOSMIN IV KIT
13.8500 | PACK | Freq: Once | INTRAVENOUS | Status: AC | PRN
  Administered 2016-07-12: 13.85 via INTRAVENOUS

## 2016-07-12 MED ORDER — REGADENOSON 0.4 MG/5ML IV SOLN
0.4000 mg | Freq: Once | INTRAVENOUS | Status: AC
  Administered 2016-07-12: 0.4 mg via INTRAVENOUS

## 2016-07-13 DIAGNOSIS — R42 Dizziness and giddiness: Secondary | ICD-10-CM | POA: Diagnosis not present

## 2016-07-13 DIAGNOSIS — I6523 Occlusion and stenosis of bilateral carotid arteries: Secondary | ICD-10-CM | POA: Diagnosis not present

## 2016-07-14 ENCOUNTER — Ambulatory Visit
Admission: RE | Admit: 2016-07-14 | Discharge: 2016-07-14 | Disposition: A | Discharge status: Home / Self Care | Payer: PPO | Source: Ambulatory Visit | Attending: Physician Assistant | Admitting: Physician Assistant

## 2016-07-14 DIAGNOSIS — G319 Degenerative disease of nervous system, unspecified: Secondary | ICD-10-CM | POA: Diagnosis not present

## 2016-07-14 DIAGNOSIS — I6782 Cerebral ischemia: Secondary | ICD-10-CM | POA: Insufficient documentation

## 2016-07-14 DIAGNOSIS — H9319 Tinnitus, unspecified ear: Secondary | ICD-10-CM | POA: Diagnosis not present

## 2016-07-14 DIAGNOSIS — R930 Abnormal findings on diagnostic imaging of skull and head, not elsewhere classified: Secondary | ICD-10-CM | POA: Diagnosis not present

## 2016-07-14 DIAGNOSIS — R42 Dizziness and giddiness: Secondary | ICD-10-CM | POA: Insufficient documentation

## 2016-07-14 MED ORDER — GADOBENATE DIMEGLUMINE 529 MG/ML IV SOLN
20.0000 mL | Freq: Once | INTRAVENOUS | Status: AC | PRN
  Administered 2016-07-14: 17 mL via INTRAVENOUS

## 2016-08-02 ENCOUNTER — Ambulatory Visit (INDEPENDENT_AMBULATORY_CARE_PROVIDER_SITE_OTHER): Payer: PPO

## 2016-08-02 ENCOUNTER — Telehealth: Payer: Self-pay | Admitting: Family Medicine

## 2016-08-02 ENCOUNTER — Ambulatory Visit: Payer: PPO

## 2016-08-02 DIAGNOSIS — R55 Syncope and collapse: Secondary | ICD-10-CM

## 2016-08-02 DIAGNOSIS — R42 Dizziness and giddiness: Secondary | ICD-10-CM | POA: Diagnosis not present

## 2016-08-02 NOTE — Telephone Encounter (Signed)
rec'd HTA auth for Echocardiogram CPT [id number] requested by Dr Almond LintAileen Ingal, cardiology.  Auth # [id number] from 07/28/16 - 01/24/17.  Dx R55 syncope/collapse

## 2016-08-06 ENCOUNTER — Ambulatory Visit
Admission: RE | Admit: 2016-08-06 | Discharge: 2016-08-06 | Disposition: A | Discharge status: Home / Self Care | Payer: PPO | Source: Ambulatory Visit | Attending: Cardiology | Admitting: Cardiology

## 2016-08-06 DIAGNOSIS — R55 Syncope and collapse: Secondary | ICD-10-CM | POA: Insufficient documentation

## 2016-08-06 DIAGNOSIS — R42 Dizziness and giddiness: Secondary | ICD-10-CM | POA: Insufficient documentation

## 2016-08-09 ENCOUNTER — Ambulatory Visit (INDEPENDENT_AMBULATORY_CARE_PROVIDER_SITE_OTHER): Payer: PPO

## 2016-08-09 ENCOUNTER — Other Ambulatory Visit: Payer: Self-pay

## 2016-08-09 DIAGNOSIS — R42 Dizziness and giddiness: Secondary | ICD-10-CM | POA: Diagnosis not present

## 2016-08-09 DIAGNOSIS — R55 Syncope and collapse: Secondary | ICD-10-CM

## 2016-08-09 LAB — ECHOCARDIOGRAM COMPLETE
Ao-asc: 31 cm
CHL CUP DOP CALC LVOT VTI: 22.1 cm
CHL CUP MV DEC (S): 195
E/e' ratio: 13.1
EWDT: 195 ms
FS: 44 % (ref 28–44)
IV/PV OW: 1.05
LA vol A4C: 60.4 ml
LA vol index: 24.3 mL/m2
LADIAMINDEX: 2.1 cm/m2
LASIZE: 41 mm
LAVOL: 47.3 mL
LEFT ATRIUM END SYS DIAM: 41 mm
LV E/e' medial: 13.1
LV TDI E'LATERAL: 5.22
LV sys vol index: 10 mL/m2
LV sys vol: 19 mL — AB (ref 21–61)
LVDIAVOL: 72 mL (ref 62–150)
LVDIAVOLIN: 37 mL/m2
LVEEAVG: 13.1
LVELAT: 5.22 cm/s
LVOT area: 3.14 cm2
LVOTD: 20 mm
LVOTPV: 99.1 cm/s
LVOTSV: 69 mL
MVPKAVEL: 84.4 m/s
MVPKEVEL: 68.4 m/s
P 1/2 time: 1309 ms
PW: 11.1 mm — AB (ref 0.6–1.1)
Simpson's disk: 73
Stroke v: 53 ml
TAPSE: 21.2 mm
TDI e' medial: 6.42

## 2016-08-12 ENCOUNTER — Telehealth: Payer: Self-pay | Admitting: *Deleted

## 2016-08-12 ENCOUNTER — Other Ambulatory Visit: Payer: PPO

## 2016-08-12 DIAGNOSIS — R55 Syncope and collapse: Secondary | ICD-10-CM

## 2016-08-12 MED ORDER — METOPROLOL SUCCINATE ER 25 MG PO TB24: 25.0000 mg | ORAL_TABLET | Freq: Every day | ORAL | 3 refills | Status: DC

## 2016-08-12 NOTE — Telephone Encounter (Signed)
Spoke with patient and reviewed results and recommendations with him. Patient states that he has appointment tomorrow with his primary care provider and he would mention the sleep study to him. Entered orders for labs and he will come in on Monday to have those done and sent in prescription for medication and instructed him to monitor his blood pressures. He verbalized understanding of our conversation, recommendations, and had no further questions at this time.

## 2016-08-12 NOTE — Telephone Encounter (Signed)
-----   Message from Almond LintAileen Ingal, MD sent at 08/11/2016  1:50 PM EST ----- There was note of paroxysmal atrial tachycardia as noted above. Recommend to check BMP, TSH, free T4, magnesium. If he would like, we can try metoprolol XL 25 mg daily. Monitor blood pressure. Follow-up in 2-3 months. Recommend that he discuss possible sleep study with PCP.

## 2016-08-13 DIAGNOSIS — H353231 Exudative age-related macular degeneration, bilateral, with active choroidal neovascularization: Secondary | ICD-10-CM | POA: Diagnosis not present

## 2016-08-16 ENCOUNTER — Other Ambulatory Visit (INDEPENDENT_AMBULATORY_CARE_PROVIDER_SITE_OTHER): Payer: PPO

## 2016-08-16 DIAGNOSIS — R55 Syncope and collapse: Secondary | ICD-10-CM | POA: Diagnosis not present

## 2016-08-17 LAB — BASIC METABOLIC PANEL
BUN / CREAT RATIO: 17 (ref 10–24)
BUN: 15 mg/dL (ref 8–27)
CO2: 24 mmol/L (ref 18–29)
CREATININE: 0.86 mg/dL (ref 0.76–1.27)
Calcium: 9.6 mg/dL (ref 8.6–10.2)
Chloride: 98 mmol/L (ref 96–106)
GFR calc Af Amer: 93 mL/min/{1.73_m2} (ref >59)
GFR calc non Af Amer: 81 mL/min/{1.73_m2} (ref >59)
GLUCOSE: 61 mg/dL — AB (ref 65–99)
POTASSIUM: 5.2 mmol/L (ref 3.5–5.2)
SODIUM: 142 mmol/L (ref 134–144)

## 2016-08-17 LAB — T4, FREE: Free T4: 1.03 ng/dL (ref 0.82–1.77)

## 2016-08-17 LAB — MAGNESIUM: MAGNESIUM: 2.4 mg/dL — AB (ref 1.6–2.3)

## 2016-08-17 LAB — TSH: TSH: 3.4 u[IU]/mL (ref 0.450–4.500)

## 2016-09-02 DIAGNOSIS — I10 Essential (primary) hypertension: Secondary | ICD-10-CM | POA: Diagnosis not present

## 2016-09-02 DIAGNOSIS — E7800 Pure hypercholesterolemia, unspecified: Secondary | ICD-10-CM | POA: Diagnosis not present

## 2016-09-02 DIAGNOSIS — H8113 Benign paroxysmal vertigo, bilateral: Secondary | ICD-10-CM | POA: Diagnosis not present

## 2016-09-03 DIAGNOSIS — H8112 Benign paroxysmal vertigo, left ear: Secondary | ICD-10-CM | POA: Diagnosis not present

## 2016-09-03 DIAGNOSIS — R2681 Unsteadiness on feet: Secondary | ICD-10-CM | POA: Diagnosis not present

## 2016-09-07 DIAGNOSIS — R2681 Unsteadiness on feet: Secondary | ICD-10-CM | POA: Diagnosis not present

## 2016-09-07 DIAGNOSIS — H8112 Benign paroxysmal vertigo, left ear: Secondary | ICD-10-CM | POA: Diagnosis not present

## 2016-09-10 DIAGNOSIS — R2681 Unsteadiness on feet: Secondary | ICD-10-CM | POA: Diagnosis not present

## 2016-09-10 DIAGNOSIS — H8112 Benign paroxysmal vertigo, left ear: Secondary | ICD-10-CM | POA: Diagnosis not present

## 2016-09-14 DIAGNOSIS — R2681 Unsteadiness on feet: Secondary | ICD-10-CM | POA: Diagnosis not present

## 2016-09-14 DIAGNOSIS — H8112 Benign paroxysmal vertigo, left ear: Secondary | ICD-10-CM | POA: Diagnosis not present

## 2016-09-17 DIAGNOSIS — H8112 Benign paroxysmal vertigo, left ear: Secondary | ICD-10-CM | POA: Diagnosis not present

## 2016-09-17 DIAGNOSIS — R2681 Unsteadiness on feet: Secondary | ICD-10-CM | POA: Diagnosis not present

## 2016-09-21 DIAGNOSIS — H8112 Benign paroxysmal vertigo, left ear: Secondary | ICD-10-CM | POA: Diagnosis not present

## 2016-09-21 DIAGNOSIS — R2681 Unsteadiness on feet: Secondary | ICD-10-CM | POA: Diagnosis not present

## 2016-09-29 DIAGNOSIS — H8112 Benign paroxysmal vertigo, left ear: Secondary | ICD-10-CM | POA: Diagnosis not present

## 2016-09-29 DIAGNOSIS — R2681 Unsteadiness on feet: Secondary | ICD-10-CM | POA: Diagnosis not present

## 2016-10-01 DIAGNOSIS — H40003 Preglaucoma, unspecified, bilateral: Secondary | ICD-10-CM | POA: Diagnosis not present

## 2016-10-01 DIAGNOSIS — H353231 Exudative age-related macular degeneration, bilateral, with active choroidal neovascularization: Secondary | ICD-10-CM | POA: Diagnosis not present

## 2016-10-06 DIAGNOSIS — R2681 Unsteadiness on feet: Secondary | ICD-10-CM | POA: Diagnosis not present

## 2016-10-06 DIAGNOSIS — H8110 Benign paroxysmal vertigo, unspecified ear: Secondary | ICD-10-CM | POA: Diagnosis not present

## 2016-10-06 DIAGNOSIS — H8112 Benign paroxysmal vertigo, left ear: Secondary | ICD-10-CM | POA: Diagnosis not present

## 2016-10-12 DIAGNOSIS — R2681 Unsteadiness on feet: Secondary | ICD-10-CM | POA: Diagnosis not present

## 2016-10-12 DIAGNOSIS — H8112 Benign paroxysmal vertigo, left ear: Secondary | ICD-10-CM | POA: Diagnosis not present

## 2016-10-15 DIAGNOSIS — H8112 Benign paroxysmal vertigo, left ear: Secondary | ICD-10-CM | POA: Diagnosis not present

## 2016-10-15 DIAGNOSIS — R2681 Unsteadiness on feet: Secondary | ICD-10-CM | POA: Diagnosis not present

## 2016-10-20 DIAGNOSIS — R2681 Unsteadiness on feet: Secondary | ICD-10-CM | POA: Diagnosis not present

## 2016-10-20 DIAGNOSIS — H8112 Benign paroxysmal vertigo, left ear: Secondary | ICD-10-CM | POA: Diagnosis not present

## 2016-10-22 DIAGNOSIS — R2681 Unsteadiness on feet: Secondary | ICD-10-CM | POA: Diagnosis not present

## 2016-10-22 DIAGNOSIS — H8112 Benign paroxysmal vertigo, left ear: Secondary | ICD-10-CM | POA: Diagnosis not present

## 2016-10-28 ENCOUNTER — Encounter: Payer: Self-pay | Admitting: Emergency Medicine

## 2016-10-28 ENCOUNTER — Emergency Department
Admission: EM | Admit: 2016-10-28 | Discharge: 2016-10-28 | Disposition: A | Discharge status: Home / Self Care | Payer: PPO | Attending: Emergency Medicine | Admitting: Emergency Medicine

## 2016-10-28 ENCOUNTER — Emergency Department: Payer: PPO

## 2016-10-28 DIAGNOSIS — K6389 Other specified diseases of intestine: Secondary | ICD-10-CM

## 2016-10-28 DIAGNOSIS — I10 Essential (primary) hypertension: Secondary | ICD-10-CM | POA: Diagnosis not present

## 2016-10-28 DIAGNOSIS — K659 Peritonitis, unspecified: Secondary | ICD-10-CM | POA: Diagnosis not present

## 2016-10-28 DIAGNOSIS — K529 Noninfective gastroenteritis and colitis, unspecified: Secondary | ICD-10-CM | POA: Diagnosis not present

## 2016-10-28 DIAGNOSIS — H8112 Benign paroxysmal vertigo, left ear: Secondary | ICD-10-CM | POA: Diagnosis not present

## 2016-10-28 DIAGNOSIS — Z79899 Other long term (current) drug therapy: Secondary | ICD-10-CM | POA: Diagnosis not present

## 2016-10-28 DIAGNOSIS — R509 Fever, unspecified: Secondary | ICD-10-CM | POA: Diagnosis not present

## 2016-10-28 DIAGNOSIS — F17220 Nicotine dependence, chewing tobacco, uncomplicated: Secondary | ICD-10-CM | POA: Diagnosis not present

## 2016-10-28 DIAGNOSIS — R2681 Unsteadiness on feet: Secondary | ICD-10-CM | POA: Diagnosis not present

## 2016-10-28 DIAGNOSIS — R109 Unspecified abdominal pain: Secondary | ICD-10-CM | POA: Diagnosis not present

## 2016-10-28 HISTORY — DX: Diverticulosis of intestine, part unspecified, without perforation or abscess without bleeding: K57.90

## 2016-10-28 HISTORY — DX: Functional dyspepsia: K30

## 2016-10-28 LAB — URINALYSIS, COMPLETE (UACMP) WITH MICROSCOPIC
BILIRUBIN URINE: NEGATIVE
Bacteria, UA: NONE SEEN
GLUCOSE, UA: NEGATIVE mg/dL
HGB URINE DIPSTICK: NEGATIVE
KETONES UR: NEGATIVE mg/dL
LEUKOCYTES UA: NEGATIVE
NITRITE: NEGATIVE
PH: 5 (ref 5.0–8.0)
Protein, ur: NEGATIVE mg/dL
SPECIFIC GRAVITY, URINE: 1.014 (ref 1.005–1.030)

## 2016-10-28 LAB — COMPREHENSIVE METABOLIC PANEL
ALT: 10 U/L — AB (ref 17–63)
AST: 21 U/L (ref 15–41)
Albumin: 3.6 g/dL (ref 3.5–5.0)
Alkaline Phosphatase: 55 U/L (ref 38–126)
Anion gap: 7 (ref 5–15)
BILIRUBIN TOTAL: 0.7 mg/dL (ref 0.3–1.2)
BUN: 13 mg/dL (ref 6–20)
CHLORIDE: 101 mmol/L (ref 101–111)
CO2: 31 mmol/L (ref 22–32)
CREATININE: 1.04 mg/dL (ref 0.61–1.24)
Calcium: 9.4 mg/dL (ref 8.9–10.3)
GFR calc non Af Amer: >60 mL/min (ref >60)
Glucose, Bld: 135 mg/dL — ABNORMAL HIGH (ref 65–99)
POTASSIUM: 4.7 mmol/L (ref 3.5–5.1)
Sodium: 139 mmol/L (ref 135–145)
TOTAL PROTEIN: 8.1 g/dL (ref 6.5–8.1)

## 2016-10-28 LAB — CBC
HCT: 40 % (ref 40.0–52.0)
Hemoglobin: 13.7 g/dL (ref 13.0–18.0)
MCH: 29.9 pg (ref 26.0–34.0)
MCHC: 34.2 g/dL (ref 32.0–36.0)
MCV: 87.3 fL (ref 80.0–100.0)
PLATELETS: 222 10*3/uL (ref 150–440)
RBC: 4.59 MIL/uL (ref 4.40–5.90)
RDW: 13 % (ref 11.5–14.5)
WBC: 10.9 10*3/uL — ABNORMAL HIGH (ref 3.8–10.6)

## 2016-10-28 LAB — LIPASE, BLOOD: LIPASE: 25 U/L (ref 11–51)

## 2016-10-28 MED ORDER — IOPAMIDOL (ISOVUE-300) INJECTION 61%
100.0000 mL | Freq: Once | INTRAVENOUS | Status: AC | PRN
  Administered 2016-10-28: 100 mL via INTRAVENOUS

## 2016-10-28 MED ORDER — IOPAMIDOL (ISOVUE-300) INJECTION 61%
30.0000 mL | Freq: Once | INTRAVENOUS | Status: AC
  Administered 2016-10-28: 30 mL via ORAL

## 2016-10-28 NOTE — ED Provider Notes (Signed)
[facility] Emergency Department Provider Note ____________________________________________   I have reviewed the triage vital signs and the triage nursing note.  HISTORY  Chief Complaint Abdominal Pain   Historian Patient  HPI Stephen Alvarez is a 83 y.o. male who is here with his wife, states he started having some left-sided abdominal pain this past Friday. He had strained that trying to change a tractor tireon Friday, although no specific injury noted at that time.  He is a fever or chills. States that the abdominal pain has gotten a little worse over time and is worse with palpation in the left side of his abdomen. He had diverticulitis before, although the last time he was actually much worse.  Today he went to [facility] clinic, was referred to the ED for further evaluation.    Past Medical History:  Diagnosis Date  . Chewing tobacco use   . Diverticulosis   . Hyperlipidemia   . Hypertension   . Indigestion     Patient Active Problem List   Diagnosis Date Noted  . Hyperlipemia 03/31/2015  . GERD (gastroesophageal reflux disease) 03/31/2015  . Chewing tobacco use     Past Surgical History:  Procedure Laterality Date  . FRACTURE SURGERY  2000   Hip, shoulder, knee  . VASECTOMY      Prior to Admission medications   Medication Sig Start Date End Date Taking? Authorizing Provider  hydrochlorothiazide (MICROZIDE) 12.5 MG capsule Take 1 capsule (12.5 mg total) by mouth daily. Patient taking differently: Take 12.5 mg by mouth at bedtime.  08/28/15   Donita Brooks, MD  meclizine (ANTIVERT) 25 MG tablet Take 1 tablet (25 mg total) by mouth 3 (three) times daily as needed for dizziness. 09/05/15   Stacy Gardner, MD  metoprolol succinate (TOPROL XL) 25 MG 24 hr tablet Take 1 tablet (25 mg total) by mouth daily. 08/12/16   Almond Lint, MD  Multiple Vitamin (MULTIVITAMIN WITH MINERALS) TABS tablet Take 1 tablet by mouth daily.    Historical  Provider, MD  omeprazole (PRILOSEC) 20 MG capsule TAKE ONE CAPSULE BY MOUTH ONCE DAILY 09/15/15   Donita Brooks, MD  simvastatin (ZOCOR) 40 MG tablet TAKE ONE TABLET BY MOUTH ONCE DAILY 09/29/15   Donita Brooks, MD    No Known Allergies  Family History  Problem Relation Age of Onset  . Stroke Father   . Depression Brother   . Diabetes Brother   . Heart disease Brother   . Hyperlipidemia Daughter   . Hypertension Daughter     Social History Social History  Substance Use Topics  . Smoking status: Never Smoker  . Smokeless tobacco: Current User    Types: Chew  . Alcohol use No    Review of Systems  Constitutional: Negative for fever. Eyes: Negative for visual changes. ENT: Negative for sore throat. Cardiovascular: Negative for chest pain. Respiratory: Negative for shortness of breath. Gastrointestinal: Negative for vomiting and diarrhea.  Typically has one bowel movement per day, in the evenings. No constipation Genitourinary: Negative for dysuria. Musculoskeletal: Negative for back pain. Skin: Negative for rash. Neurological: Negative for headache. 10 point Review of Systems otherwise negative ____________________________________________   PHYSICAL EXAM:  VITAL SIGNS: ED Triage Vitals  Enc Vitals Group     BP 10/28/16 1438 (!) 168/83     Pulse Rate 10/28/16 1438 77     Resp 10/28/16 1438 18     Temp 10/28/16 1438 98.7 F (37.1 C)  Temp Source 10/28/16 1438 Oral     SpO2 10/28/16 1438 97 %     Weight 10/28/16 1438 190 lb (86.2 kg)     Height 10/28/16 1438 5\' 7"  (1.702 m)     Head Circumference --      Peak Flow --      Pain Score 10/28/16 1445 8     Pain Loc --      Pain Edu? --      Excl. in GC? --      Constitutional: Alert and oriented. Well appearing and in no distress. HEENT   Head: Normocephalic and atraumatic.      Eyes: Conjunctivae are normal. PERRL. Normal extraocular movements.      Ears:         Nose: No  congestion/rhinnorhea.   Mouth/Throat: Mucous membranes are moist.   Neck: No stridor. Cardiovascular/Chest: Normal rate, regular rhythm.  No murmurs, rubs, or gallops. Respiratory: Normal respiratory effort without tachypnea nor retractions. Breath sounds are clear and equal bilaterally. No wheezes/rales/rhonchi. Gastrointestinal: Soft. No distention, no guarding, no rebound. Moderate tenderness to palpation mid and left lower abdomen. No suprapubic or right lower quadrant tenderness to palpation. Genitourinary/rectal:Deferred Musculoskeletal: Nontender with normal range of motion in all extremities. No joint effusions.  No lower extremity tenderness.  No edema. Neurologic:  Normal speech and language. No gross or focal neurologic deficits are appreciated. Skin:  Skin is warm, dry and intact. No rash noted. Psychiatric: Mood and affect are normal. Speech and behavior are normal. Patient exhibits appropriate insight and judgment.   ____________________________________________  LABS (pertinent positives/negatives)  Labs Reviewed  COMPREHENSIVE METABOLIC PANEL - Abnormal; Notable for the following:       Result Value   Glucose, Bld 135 (*)    ALT 10 (*)    All other components within normal limits  CBC - Abnormal; Notable for the following:    WBC 10.9 (*)    All other components within normal limits  URINALYSIS, COMPLETE (UACMP) WITH MICROSCOPIC - Abnormal; Notable for the following:    Color, Urine YELLOW (*)    APPearance CLEAR (*)    Squamous Epithelial / LPF 0-5 (*)    All other components within normal limits  LIPASE, BLOOD    ____________________________________________    EKG I, Governor Rooks, MD, the attending physician have personally viewed and interpreted all ECGs.  76 bpm. Normal sinus rhythm First degree AV block.  Narrow QRS. Nonspecific ST and T-wave ____________________________________________  RADIOLOGY All Xrays were viewed by me. Imaging interpreted  by Radiologist.  CT abdomen and pelvis with contrast:  IMPRESSION: Abnormality in the left mid abdomen just below the anterior abdominal wall most consistent with epiploic appendagitis. This has fatty density with surrounding inflammation.  Sigmoid diverticulosis without diverticulitis.  Moderate prostate enlargement with bladder wall thickening.  Large hiatal hernia __________________________________________  PROCEDURES  Procedure(s) performed: None  Critical Care performed: None  ____________________________________________   ED COURSE / ASSESSMENT AND PLAN  Pertinent labs & imaging results that were available during my care of the patient were reviewed by me and considered in my medical decision making (see chart for details).   Mr. Tillis is overall well-appearing, but does have tenderness in the left lower quadrant of abdomen. Stable vital signs. Slightly elevated white blood count. Pain level is 4 out of 10 and patient is declining pain medication at this point in time. We discussed risk and benefit of CT and chest to proceed.  CT scan shows  findings consistent with epiploic appendagitis. Discussed conservative management with pain control and follow-up with primary care doctor.   CONSULTATIONS:  None   Patient / Family / Caregiver informed of clinical course, medical decision-making process, and agree with plan.   I discussed return precautions, follow-up instructions, and discharge instructions with patient and/or family.   ___________________________________________   FINAL CLINICAL IMPRESSION(S) / ED DIAGNOSES   Final diagnoses:  Epiploic appendagitis              Note: This dictation was prepared with Dragon dictation. Any transcriptional errors that result from this process are unintentional    Governor Rooksebecca Avis Mcmahill, MD 10/28/16 [phone]

## 2016-10-28 NOTE — Discharge Instructions (Signed)
Return to the ER for any worsening abdominal pain, urinary problems, black or bloody stools, fever, trouble breathing, chest discomfort or any other symptoms concerning to you.  For now, treat pain with tylenol or ibuprofen, used as directed on over the counter labeling.  Follow up with your primary care doctor.

## 2016-10-28 NOTE — ED Triage Notes (Signed)
[facility] to [facility] c/o LUQ pain since Friday after changing tractor tire.  Pt states pain getting worse, presents with swelling to left stomach, soft, tender to touch.  Hx of diverticulitis.

## 2016-11-01 DIAGNOSIS — Z79899 Other long term (current) drug therapy: Secondary | ICD-10-CM | POA: Diagnosis not present

## 2016-11-01 DIAGNOSIS — R1012 Left upper quadrant pain: Secondary | ICD-10-CM | POA: Diagnosis not present

## 2016-11-03 DIAGNOSIS — H8112 Benign paroxysmal vertigo, left ear: Secondary | ICD-10-CM | POA: Diagnosis not present

## 2016-11-03 DIAGNOSIS — R2681 Unsteadiness on feet: Secondary | ICD-10-CM | POA: Diagnosis not present

## 2016-11-10 DIAGNOSIS — R2681 Unsteadiness on feet: Secondary | ICD-10-CM | POA: Diagnosis not present

## 2016-11-10 DIAGNOSIS — H8112 Benign paroxysmal vertigo, left ear: Secondary | ICD-10-CM | POA: Diagnosis not present

## 2016-11-12 DIAGNOSIS — H353221 Exudative age-related macular degeneration, left eye, with active choroidal neovascularization: Secondary | ICD-10-CM | POA: Diagnosis not present

## 2016-11-17 DIAGNOSIS — R2681 Unsteadiness on feet: Secondary | ICD-10-CM | POA: Diagnosis not present

## 2016-11-17 DIAGNOSIS — H8112 Benign paroxysmal vertigo, left ear: Secondary | ICD-10-CM | POA: Diagnosis not present

## 2016-11-26 DIAGNOSIS — H353231 Exudative age-related macular degeneration, bilateral, with active choroidal neovascularization: Secondary | ICD-10-CM | POA: Diagnosis not present

## 2016-12-01 DIAGNOSIS — H8112 Benign paroxysmal vertigo, left ear: Secondary | ICD-10-CM | POA: Diagnosis not present

## 2016-12-01 DIAGNOSIS — R2681 Unsteadiness on feet: Secondary | ICD-10-CM | POA: Diagnosis not present

## 2016-12-17 ENCOUNTER — Telehealth: Payer: Self-pay | Admitting: Cardiology

## 2016-12-17 NOTE — Telephone Encounter (Signed)
Patient had echo in 08/2016 and there is a referral in wq for echo.  Is this an old referral or should patient repeat last echo.  Please advise

## 2016-12-17 NOTE — Telephone Encounter (Signed)
I do not see an order for repeat echocardiogram.

## 2016-12-24 DIAGNOSIS — R7309 Other abnormal glucose: Secondary | ICD-10-CM | POA: Diagnosis not present

## 2016-12-24 DIAGNOSIS — R270 Ataxia, unspecified: Secondary | ICD-10-CM | POA: Diagnosis not present

## 2016-12-24 DIAGNOSIS — Z0000 Encounter for general adult medical examination without abnormal findings: Secondary | ICD-10-CM | POA: Diagnosis not present

## 2016-12-24 DIAGNOSIS — Z125 Encounter for screening for malignant neoplasm of prostate: Secondary | ICD-10-CM | POA: Diagnosis not present

## 2016-12-24 DIAGNOSIS — Z79899 Other long term (current) drug therapy: Secondary | ICD-10-CM | POA: Diagnosis not present

## 2016-12-24 DIAGNOSIS — I10 Essential (primary) hypertension: Secondary | ICD-10-CM | POA: Diagnosis not present

## 2016-12-24 DIAGNOSIS — E7800 Pure hypercholesterolemia, unspecified: Secondary | ICD-10-CM | POA: Diagnosis not present

## 2016-12-28 DIAGNOSIS — Z9181 History of falling: Secondary | ICD-10-CM | POA: Diagnosis not present

## 2016-12-28 DIAGNOSIS — I10 Essential (primary) hypertension: Secondary | ICD-10-CM | POA: Diagnosis not present

## 2016-12-28 DIAGNOSIS — Z7982 Long term (current) use of aspirin: Secondary | ICD-10-CM | POA: Diagnosis not present

## 2016-12-28 DIAGNOSIS — N400 Enlarged prostate without lower urinary tract symptoms: Secondary | ICD-10-CM | POA: Diagnosis not present

## 2016-12-28 DIAGNOSIS — M6281 Muscle weakness (generalized): Secondary | ICD-10-CM | POA: Diagnosis not present

## 2016-12-28 DIAGNOSIS — M1990 Unspecified osteoarthritis, unspecified site: Secondary | ICD-10-CM | POA: Diagnosis not present

## 2016-12-28 DIAGNOSIS — R262 Difficulty in walking, not elsewhere classified: Secondary | ICD-10-CM | POA: Diagnosis not present

## 2016-12-28 DIAGNOSIS — R260 Ataxic gait: Secondary | ICD-10-CM | POA: Diagnosis not present

## 2016-12-31 DIAGNOSIS — M6281 Muscle weakness (generalized): Secondary | ICD-10-CM | POA: Diagnosis not present

## 2016-12-31 DIAGNOSIS — I10 Essential (primary) hypertension: Secondary | ICD-10-CM | POA: Diagnosis not present

## 2016-12-31 DIAGNOSIS — M1990 Unspecified osteoarthritis, unspecified site: Secondary | ICD-10-CM | POA: Diagnosis not present

## 2016-12-31 DIAGNOSIS — Z9181 History of falling: Secondary | ICD-10-CM | POA: Diagnosis not present

## 2016-12-31 DIAGNOSIS — R260 Ataxic gait: Secondary | ICD-10-CM | POA: Diagnosis not present

## 2016-12-31 DIAGNOSIS — N400 Enlarged prostate without lower urinary tract symptoms: Secondary | ICD-10-CM | POA: Diagnosis not present

## 2016-12-31 DIAGNOSIS — R262 Difficulty in walking, not elsewhere classified: Secondary | ICD-10-CM | POA: Diagnosis not present

## 2016-12-31 DIAGNOSIS — Z7982 Long term (current) use of aspirin: Secondary | ICD-10-CM | POA: Diagnosis not present

## 2017-01-04 DIAGNOSIS — I10 Essential (primary) hypertension: Secondary | ICD-10-CM | POA: Diagnosis not present

## 2017-01-04 DIAGNOSIS — N400 Enlarged prostate without lower urinary tract symptoms: Secondary | ICD-10-CM | POA: Diagnosis not present

## 2017-01-04 DIAGNOSIS — M1990 Unspecified osteoarthritis, unspecified site: Secondary | ICD-10-CM | POA: Diagnosis not present

## 2017-01-04 DIAGNOSIS — R260 Ataxic gait: Secondary | ICD-10-CM | POA: Diagnosis not present

## 2017-01-04 DIAGNOSIS — Z9181 History of falling: Secondary | ICD-10-CM | POA: Diagnosis not present

## 2017-01-04 DIAGNOSIS — M6281 Muscle weakness (generalized): Secondary | ICD-10-CM | POA: Diagnosis not present

## 2017-01-04 DIAGNOSIS — R262 Difficulty in walking, not elsewhere classified: Secondary | ICD-10-CM | POA: Diagnosis not present

## 2017-01-04 DIAGNOSIS — Z7982 Long term (current) use of aspirin: Secondary | ICD-10-CM | POA: Diagnosis not present

## 2017-01-18 DIAGNOSIS — I10 Essential (primary) hypertension: Secondary | ICD-10-CM | POA: Diagnosis not present

## 2017-01-18 DIAGNOSIS — Z9181 History of falling: Secondary | ICD-10-CM | POA: Diagnosis not present

## 2017-01-18 DIAGNOSIS — M6281 Muscle weakness (generalized): Secondary | ICD-10-CM | POA: Diagnosis not present

## 2017-01-18 DIAGNOSIS — N400 Enlarged prostate without lower urinary tract symptoms: Secondary | ICD-10-CM | POA: Diagnosis not present

## 2017-01-18 DIAGNOSIS — M1990 Unspecified osteoarthritis, unspecified site: Secondary | ICD-10-CM | POA: Diagnosis not present

## 2017-02-04 DIAGNOSIS — H353231 Exudative age-related macular degeneration, bilateral, with active choroidal neovascularization: Secondary | ICD-10-CM | POA: Diagnosis not present

## 2017-02-11 DIAGNOSIS — R413 Other amnesia: Secondary | ICD-10-CM | POA: Diagnosis not present

## 2017-02-11 DIAGNOSIS — R269 Unspecified abnormalities of gait and mobility: Secondary | ICD-10-CM | POA: Diagnosis not present

## 2017-03-25 DIAGNOSIS — H8113 Benign paroxysmal vertigo, bilateral: Secondary | ICD-10-CM | POA: Diagnosis not present

## 2017-03-25 DIAGNOSIS — I10 Essential (primary) hypertension: Secondary | ICD-10-CM | POA: Diagnosis not present

## 2017-03-25 DIAGNOSIS — Z79899 Other long term (current) drug therapy: Secondary | ICD-10-CM | POA: Diagnosis not present

## 2017-03-25 DIAGNOSIS — E7800 Pure hypercholesterolemia, unspecified: Secondary | ICD-10-CM | POA: Diagnosis not present

## 2017-03-25 DIAGNOSIS — G20 Parkinson's disease: Secondary | ICD-10-CM | POA: Diagnosis not present

## 2017-04-01 DIAGNOSIS — H40003 Preglaucoma, unspecified, bilateral: Secondary | ICD-10-CM | POA: Diagnosis not present

## 2017-04-01 DIAGNOSIS — H353211 Exudative age-related macular degeneration, right eye, with active choroidal neovascularization: Secondary | ICD-10-CM | POA: Diagnosis not present

## 2017-04-01 DIAGNOSIS — H353221 Exudative age-related macular degeneration, left eye, with active choroidal neovascularization: Secondary | ICD-10-CM | POA: Diagnosis not present

## 2017-04-27 DIAGNOSIS — R269 Unspecified abnormalities of gait and mobility: Secondary | ICD-10-CM | POA: Diagnosis not present

## 2017-04-27 DIAGNOSIS — R413 Other amnesia: Secondary | ICD-10-CM | POA: Diagnosis not present

## 2017-04-27 DIAGNOSIS — F5101 Primary insomnia: Secondary | ICD-10-CM | POA: Diagnosis not present

## 2017-05-09 ENCOUNTER — Encounter (HOSPITAL_COMMUNITY): Payer: Self-pay

## 2017-05-09 ENCOUNTER — Emergency Department (HOSPITAL_COMMUNITY): Payer: PPO

## 2017-05-09 ENCOUNTER — Emergency Department (HOSPITAL_COMMUNITY)
Admission: EM | Admit: 2017-05-09 | Discharge: 2017-05-09 | Disposition: A | Discharge status: Home / Self Care | Payer: PPO | Attending: Emergency Medicine | Admitting: Emergency Medicine

## 2017-05-09 DIAGNOSIS — R4789 Other speech disturbances: Secondary | ICD-10-CM | POA: Diagnosis not present

## 2017-05-09 DIAGNOSIS — Z79899 Other long term (current) drug therapy: Secondary | ICD-10-CM | POA: Insufficient documentation

## 2017-05-09 DIAGNOSIS — F8081 Childhood onset fluency disorder: Secondary | ICD-10-CM

## 2017-05-09 DIAGNOSIS — F17220 Nicotine dependence, chewing tobacco, uncomplicated: Secondary | ICD-10-CM | POA: Insufficient documentation

## 2017-05-09 DIAGNOSIS — I10 Essential (primary) hypertension: Secondary | ICD-10-CM | POA: Insufficient documentation

## 2017-05-09 DIAGNOSIS — R001 Bradycardia, unspecified: Secondary | ICD-10-CM | POA: Diagnosis not present

## 2017-05-09 DIAGNOSIS — R4702 Dysphasia: Secondary | ICD-10-CM | POA: Diagnosis not present

## 2017-05-09 DIAGNOSIS — F985 Adult onset fluency disorder: Secondary | ICD-10-CM | POA: Diagnosis not present

## 2017-05-09 DIAGNOSIS — I6789 Other cerebrovascular disease: Secondary | ICD-10-CM | POA: Diagnosis not present

## 2017-05-09 LAB — BASIC METABOLIC PANEL
Anion gap: 7 (ref 5–15)
BUN: 11 mg/dL (ref 6–20)
CO2: 29 mmol/L (ref 22–32)
Calcium: 8.8 mg/dL — ABNORMAL LOW (ref 8.9–10.3)
Chloride: 104 mmol/L (ref 101–111)
Creatinine, Ser: 1 mg/dL (ref 0.61–1.24)
GLUCOSE: 90 mg/dL (ref 65–99)
POTASSIUM: 3.7 mmol/L (ref 3.5–5.1)
Sodium: 140 mmol/L (ref 135–145)

## 2017-05-09 LAB — URINALYSIS, ROUTINE W REFLEX MICROSCOPIC
Bilirubin Urine: NEGATIVE
GLUCOSE, UA: NEGATIVE mg/dL
Hgb urine dipstick: NEGATIVE
Ketones, ur: NEGATIVE mg/dL
LEUKOCYTES UA: NEGATIVE
NITRITE: NEGATIVE
PH: 6 (ref 5.0–8.0)
Protein, ur: NEGATIVE mg/dL
SPECIFIC GRAVITY, URINE: 1.006 (ref 1.005–1.030)

## 2017-05-09 LAB — CBC WITH DIFFERENTIAL/PLATELET
BASOS ABS: 0.1 10*3/uL (ref 0.0–0.1)
BASOS PCT: 1 %
Eosinophils Absolute: 0.5 10*3/uL (ref 0.0–0.7)
Eosinophils Relative: 7 %
HEMATOCRIT: 39.9 % (ref 39.0–52.0)
Hemoglobin: 13 g/dL (ref 13.0–17.0)
LYMPHS PCT: 29 %
Lymphs Abs: 2.2 10*3/uL (ref 0.7–4.0)
MCH: 28.9 pg (ref 26.0–34.0)
MCHC: 32.6 g/dL (ref 30.0–36.0)
MCV: 88.7 fL (ref 78.0–100.0)
MONO ABS: 0.8 10*3/uL (ref 0.1–1.0)
Monocytes Relative: 11 %
NEUTROS ABS: 3.8 10*3/uL (ref 1.7–7.7)
NEUTROS PCT: 52 %
Platelets: 204 10*3/uL (ref 150–400)
RBC: 4.5 MIL/uL (ref 4.22–5.81)
RDW: 13.9 % (ref 11.5–15.5)
WBC: 7.3 10*3/uL (ref 4.0–10.5)

## 2017-05-09 NOTE — ED Provider Notes (Signed)
**Note Stephen-Identified via Obfuscation** [facility] DEPT Provider Note   CSN: [id number] Arrival date & time: 05/09/17  [id number]     History   Chief Complaint Chief Complaint  Patient presents with  . Aphasia    HPI Stephen Alvarez is a 83 y.o. male.  HPI Patient, with past medical history of possible Parkinson's, cognitive impairment, presents to ED for evaluation of stuttering. He states that he fell asleep last night around 10:30 PM feeling normal. He felt like "there is something trying to fight me from inside of me," when he woke up this morning. He was recently started on a higher dose of Sinemet for his Parkinson's, which has been giving him bad dreams at night. He denies any falls, numbness, weakness, trouble walking, chest pain, trouble breathing, abdominal pain, vision changes. Patient lives at home with wife.  Past Medical History:  Diagnosis Date  . Chewing tobacco use   . Diverticulosis   . Hyperlipidemia   . Hypertension   . Indigestion     Patient Active Problem List   Diagnosis Date Noted  . Hyperlipemia 03/31/2015  . GERD (gastroesophageal reflux disease) 03/31/2015  . Chewing tobacco use     Past Surgical History:  Procedure Laterality Date  . FRACTURE SURGERY  2000   Hip, shoulder, knee  . VASECTOMY         Home Medications    Prior to Admission medications   Medication Sig Start Date End Date Taking? Authorizing Provider  hydrochlorothiazide (MICROZIDE) 12.5 MG capsule Take 1 capsule (12.5 mg total) by mouth daily. Patient taking differently: Take 12.5 mg by mouth at bedtime.  08/28/15   Donita BrooksPickard, Warren T, MD  meclizine (ANTIVERT) 25 MG tablet Take 1 tablet (25 mg total) by mouth 3 (three) times daily as needed for dizziness. 09/05/15   Stacy GardnerSeymore, Andrew, MD  metoprolol succinate (TOPROL XL) 25 MG 24 hr tablet Take 1 tablet (25 mg total) by mouth daily. 08/12/16   Almond LintIngal, Aileen, MD  Multiple Vitamin (MULTIVITAMIN WITH MINERALS) TABS tablet Take 1 tablet by mouth daily.    [provider]  omeprazole (PRILOSEC) 20 MG capsule TAKE ONE CAPSULE BY MOUTH ONCE DAILY 09/15/15   Donita BrooksPickard, Warren T, MD  simvastatin (ZOCOR) 40 MG tablet TAKE ONE TABLET BY MOUTH ONCE DAILY 09/29/15   Donita BrooksPickard, Warren T, MD    Family History Family History  Problem Relation Age of Onset  . Stroke Father   . Depression Brother   . Diabetes Brother   . Heart disease Brother   . Hyperlipidemia Daughter   . Hypertension Daughter     Social History Social History  Substance Use Topics  . Smoking status: Never Smoker  . Smokeless tobacco: Current User    Types: Chew  . Alcohol use No     Allergies   Patient has no known allergies.   Review of Systems Review of Systems  Constitutional: Negative for appetite change, chills and fever.  HENT: Negative for ear pain, rhinorrhea, sneezing and sore throat.   Eyes: Negative for photophobia and visual disturbance.  Respiratory: Negative for cough, chest tightness, shortness of breath and wheezing.   Cardiovascular: Negative for chest pain and palpitations.  Gastrointestinal: Negative for abdominal pain, blood in stool, constipation, diarrhea, nausea and vomiting.  Genitourinary: Negative for dysuria, hematuria and urgency.  Musculoskeletal: Negative for myalgias.  Skin: Negative for rash.  Neurological: Positive for speech difficulty. Negative for dizziness, weakness and light-headedness.     Physical Exam Updated Vital Signs BP Marland Kitchen(!)  178/89 (BP Location: Right Arm)   Pulse (!) 56   Temp 97.9 F (36.6 C) (Oral)   Resp 18   SpO2 97%   Physical Exam  Constitutional: He appears well-developed and well-nourished. No distress.  Elderly-appearing male, nontoxic appearing and in no acute distress. Pleasant.  HENT:  Head: Normocephalic and atraumatic.  Nose: Nose normal.  Eyes: Pupils are equal, round, and reactive to light. Conjunctivae and EOM are normal. Right eye exhibits no discharge. Left eye exhibits no discharge. No scleral  icterus.  Neck: Normal range of motion. Neck supple.  Cardiovascular: Regular rhythm, normal heart sounds and intact distal pulses.  Bradycardia present.  Exam reveals no gallop and no friction rub.   No murmur heard. Pulmonary/Chest: Effort normal and breath sounds normal. No respiratory distress.  Abdominal: Soft. Bowel sounds are normal. He exhibits no distension. There is no tenderness. There is no guarding.  Musculoskeletal: Normal range of motion. He exhibits no edema.  Neurological: He is alert. No cranial nerve deficit or sensory deficit. He exhibits normal muscle tone. Coordination normal.  He does appear to have stuttering while speaking. Alert and oriented to person, place, month, day and current events. Not oriented to year. Equal grip strength bilaterally. Strength 5/5 in bilateral lower extremities. Sensation intact to light touch in upper and lower extremities. No facial asymmetry noted. Cranial nerves appear grossly intact. Normal finger to nose movement.  Skin: Skin is warm and dry. No rash noted.  Psychiatric: He has a normal mood and affect.  Nursing note and vitals reviewed.    ED Treatments / Results  Labs (all labs ordered are listed, but only abnormal results are displayed) Labs Reviewed  BASIC METABOLIC PANEL - Abnormal; Notable for the following:       Result Value   Calcium 8.8 (*)    All other components within normal limits  URINALYSIS, ROUTINE W REFLEX MICROSCOPIC - Abnormal; Notable for the following:    Color, Urine STRAW (*)    All other components within normal limits  CBC WITH DIFFERENTIAL/PLATELET    EKG  EKG Interpretation None       Radiology Ct Head Wo Contrast  Result Date: 05/09/2017 CLINICAL DATA:  83 year old with acute onset of stuttering when speaking which persists. EXAM: CT HEAD WITHOUT CONTRAST TECHNIQUE: Contiguous axial images were obtained from the base of the skull through the vertex without intravenous contrast. COMPARISON:   MRI brain (IACs) 07/14/2016. CT head 09/05/2015, 11/26/2008. FINDINGS: Brain: Moderate to severe age related cortical and deep atrophy, stable since 2016 and slightly progressive since 2010. Mild changes of small vessel disease of the white matter. No mass lesion. No midline shift. No acute hemorrhage or hematoma. No extra-axial fluid collections. No evidence of acute infarction. Vascular: Severe bilateral carotid siphon and moderate bilateral vertebral artery atherosclerosis. Skull: No skull fracture or other focal osseous abnormality involving the skull. Sinuses/Orbits: Visualized paranasal sinuses, bilateral mastoid air cells and bilateral middle ear cavities well-aerated. Visualized orbits and globes are normal. Other: None. IMPRESSION: 1. No acute intracranial abnormality. 2. Moderate to severe age related generalized atrophy and mild chronic microvascular ischemic changes of the white matter. Electronically Signed   By: Hulan Saas M.D.   On: 05/09/2017 10:45    Procedures Procedures (including critical care time)  Medications Ordered in ED Medications - No data to display   Initial Impression / Assessment and Plan / ED Course  I have reviewed the triage vital signs and the nursing notes.  Pertinent  labs & imaging results that were available during my care of the patient were reviewed by me and considered in my medical decision making (see chart for details).     Patient presents to ED for evaluation of stuttering, normal when going to bed about 12hrs prior to arrival. Per chart review, patient seen at [facility] neurology and is currently being evaluated for possible Parkinson's versus white matter disease. He was recently started on a higher dose of Sinemet and reports nightmares because of it. He is constantly stating that "there is something fighting inside of me." Per wife, patient has been stuttering more than usual. On physical exam there are no focal deficits on neurological exam. Equal  grip strength bilaterally, normal finger to nose, no facial asymmetry and cranial nerves appear grossly intact. He is afebrile with no history of fever. He is hypertensive here and he denies any history of hypertension, however was chart reviewed states that he does have a history of hypertension. CT of the head returned as negative for acute abnormality and showed age-related generalized atrophy and chronic ischemic changes of the white matter. CBC, BMP unremarkable. Urinalysis showed no evidence of UTI or dehydration. I do not suspect that this is due to a CVA or other acute intracranial abnormality based on nonfocal neurologic exam. Could be a side effect of his medications. Advised patient to follow up with his neurologist for medication adjustments or further evaluation. Strict return precautions given.  Patient discussed with and seen by Dr. Lynelle Doctor.  Final Clinical Impressions(s) / ED Diagnoses   Final diagnoses:  Stuttering    New Prescriptions New Prescriptions   No medications on file     Dietrich Pates, PA-C 05/09/17 1138    Linwood Dibbles, MD 05/09/17 [phone]

## 2017-05-09 NOTE — Discharge Instructions (Signed)
Follow up with your PCP and neurologist for further evaluation in the next 1-2 days. Continue home medications as previously prescribed. Return to ED for trouble breathing, trouble swallowing, chest pain, numbness, vision changes, injuries or falls.

## 2017-05-09 NOTE — ED Triage Notes (Signed)
Pt from home with a new on set of difficulty speaking. Pt has not pain and was LSN at 2230 last night. Pt has strong equal grips and only disoriented to year.

## 2017-05-10 DIAGNOSIS — H353221 Exudative age-related macular degeneration, left eye, with active choroidal neovascularization: Secondary | ICD-10-CM | POA: Diagnosis not present

## 2017-05-10 DIAGNOSIS — H353211 Exudative age-related macular degeneration, right eye, with active choroidal neovascularization: Secondary | ICD-10-CM | POA: Diagnosis not present

## 2017-05-23 DIAGNOSIS — R269 Unspecified abnormalities of gait and mobility: Secondary | ICD-10-CM | POA: Diagnosis not present

## 2017-05-23 DIAGNOSIS — R42 Dizziness and giddiness: Secondary | ICD-10-CM | POA: Diagnosis not present

## 2017-05-23 DIAGNOSIS — R413 Other amnesia: Secondary | ICD-10-CM | POA: Diagnosis not present

## 2017-05-23 DIAGNOSIS — F5101 Primary insomnia: Secondary | ICD-10-CM | POA: Diagnosis not present

## 2017-05-30 DIAGNOSIS — H524 Presbyopia: Secondary | ICD-10-CM | POA: Diagnosis not present

## 2017-05-30 DIAGNOSIS — H353231 Exudative age-related macular degeneration, bilateral, with active choroidal neovascularization: Secondary | ICD-10-CM | POA: Diagnosis not present

## 2017-06-16 DIAGNOSIS — Z23 Encounter for immunization: Secondary | ICD-10-CM | POA: Diagnosis not present

## 2017-06-22 DIAGNOSIS — E7800 Pure hypercholesterolemia, unspecified: Secondary | ICD-10-CM | POA: Diagnosis not present

## 2017-06-22 DIAGNOSIS — I10 Essential (primary) hypertension: Secondary | ICD-10-CM | POA: Diagnosis not present

## 2017-06-22 DIAGNOSIS — Z79899 Other long term (current) drug therapy: Secondary | ICD-10-CM | POA: Diagnosis not present

## 2017-06-29 DIAGNOSIS — G20 Parkinson's disease: Secondary | ICD-10-CM | POA: Diagnosis not present

## 2017-06-29 DIAGNOSIS — E7800 Pure hypercholesterolemia, unspecified: Secondary | ICD-10-CM | POA: Diagnosis not present

## 2017-06-29 DIAGNOSIS — I10 Essential (primary) hypertension: Secondary | ICD-10-CM | POA: Diagnosis not present

## 2017-07-05 DIAGNOSIS — H353231 Exudative age-related macular degeneration, bilateral, with active choroidal neovascularization: Secondary | ICD-10-CM | POA: Diagnosis not present

## 2017-07-05 DIAGNOSIS — H40003 Preglaucoma, unspecified, bilateral: Secondary | ICD-10-CM | POA: Diagnosis not present

## 2017-07-25 ENCOUNTER — Emergency Department
Admission: EM | Admit: 2017-07-25 | Discharge: 2017-07-25 | Disposition: A | Discharge status: Home / Self Care | Payer: PPO | Attending: Emergency Medicine | Admitting: Emergency Medicine

## 2017-07-25 ENCOUNTER — Emergency Department: Payer: PPO

## 2017-07-25 ENCOUNTER — Other Ambulatory Visit: Payer: Self-pay

## 2017-07-25 ENCOUNTER — Encounter: Payer: Self-pay | Admitting: Emergency Medicine

## 2017-07-25 DIAGNOSIS — S66811A Strain of other specified muscles, fascia and tendons at wrist and hand level, right hand, initial encounter: Secondary | ICD-10-CM | POA: Diagnosis not present

## 2017-07-25 DIAGNOSIS — Y929 Unspecified place or not applicable: Secondary | ICD-10-CM | POA: Insufficient documentation

## 2017-07-25 DIAGNOSIS — S62632B Displaced fracture of distal phalanx of right middle finger, initial encounter for open fracture: Secondary | ICD-10-CM | POA: Diagnosis not present

## 2017-07-25 DIAGNOSIS — S61312A Laceration without foreign body of right middle finger with damage to nail, initial encounter: Secondary | ICD-10-CM | POA: Diagnosis not present

## 2017-07-25 DIAGNOSIS — F17220 Nicotine dependence, chewing tobacco, uncomplicated: Secondary | ICD-10-CM | POA: Insufficient documentation

## 2017-07-25 DIAGNOSIS — W230XXA Caught, crushed, jammed, or pinched between moving objects, initial encounter: Secondary | ICD-10-CM | POA: Insufficient documentation

## 2017-07-25 DIAGNOSIS — Z79899 Other long term (current) drug therapy: Secondary | ICD-10-CM | POA: Diagnosis not present

## 2017-07-25 DIAGNOSIS — I10 Essential (primary) hypertension: Secondary | ICD-10-CM | POA: Diagnosis not present

## 2017-07-25 DIAGNOSIS — Y999 Unspecified external cause status: Secondary | ICD-10-CM | POA: Diagnosis not present

## 2017-07-25 DIAGNOSIS — S66322A Laceration of extensor muscle, fascia and tendon of right middle finger at wrist and hand level, initial encounter: Secondary | ICD-10-CM | POA: Diagnosis not present

## 2017-07-25 DIAGNOSIS — S62632A Displaced fracture of distal phalanx of right middle finger, initial encounter for closed fracture: Secondary | ICD-10-CM | POA: Diagnosis not present

## 2017-07-25 DIAGNOSIS — Y939 Activity, unspecified: Secondary | ICD-10-CM | POA: Insufficient documentation

## 2017-07-25 DIAGNOSIS — Z7982 Long term (current) use of aspirin: Secondary | ICD-10-CM | POA: Diagnosis not present

## 2017-07-25 DIAGNOSIS — W3089XA Contact with other specified agricultural machinery, initial encounter: Secondary | ICD-10-CM | POA: Insufficient documentation

## 2017-07-25 DIAGNOSIS — S61212A Laceration without foreign body of right middle finger without damage to nail, initial encounter: Secondary | ICD-10-CM | POA: Diagnosis not present

## 2017-07-25 DIAGNOSIS — S62630A Displaced fracture of distal phalanx of right index finger, initial encounter for closed fracture: Secondary | ICD-10-CM | POA: Diagnosis not present

## 2017-07-25 DIAGNOSIS — Z23 Encounter for immunization: Secondary | ICD-10-CM | POA: Diagnosis not present

## 2017-07-25 MED ORDER — LIDOCAINE HCL (PF) 1 % IJ SOLN
5.0000 mL | Freq: Once | INTRAMUSCULAR | Status: AC
  Administered 2017-07-25: 5 mL via INTRADERMAL
  Filled 2017-07-25: qty 5

## 2017-07-25 MED ORDER — TETANUS-DIPHTH-ACELL PERTUSSIS 5-2.5-18.5 LF-MCG/0.5 IM SUSP
0.5000 mL | Freq: Once | INTRAMUSCULAR | Status: AC
  Administered 2017-07-25: 0.5 mL via INTRAMUSCULAR
  Filled 2017-07-25: qty 0.5

## 2017-07-25 MED ORDER — CEPHALEXIN 500 MG PO CAPS: 500.0000 mg | ORAL_CAPSULE | Freq: Four times a day (QID) | ORAL | 0 refills | Status: AC

## 2017-07-25 MED ORDER — CEPHALEXIN 500 MG PO CAPS
500.0000 mg | ORAL_CAPSULE | Freq: Once | ORAL | Status: AC
  Administered 2017-07-25: 500 mg via ORAL
  Filled 2017-07-25: qty 1

## 2017-07-25 NOTE — ED Notes (Signed)
Pt ambulatory upon discharge to wheelchair with wife. Pt and wife verbalized importance of following up with the orthopaedic surgeon, antibiotic use and discharge instructions. Pt A&O x4. Skin warm and dry.

## 2017-07-25 NOTE — Discharge Instructions (Signed)
Please call Dr. Stephenie AcresSoria tomorrow (11/20) for appointment ASAP to discuss tendon repair and wound care. Do not remove splint until appointment with Dr. Stephenie AcresSoria. Take Keflex four times per day to prevent hand infection.

## 2017-07-25 NOTE — ED Triage Notes (Signed)
[facility] with laceration and trauma to right middle finger from woodsplitter accident. Pt's finger is wrapped and bleeding appears to be controlled. Pt alert & oriented with NAD noted.

## 2017-07-25 NOTE — ED Provider Notes (Signed)
[facility] Emergency Department Provider Note  ____________________________________________  Time seen: Approximately 4:44 PM  I have reviewed the triage vital signs and the nursing notes.   HISTORY  Chief Complaint Hand Pain    HPI De Stephen Alvarez is a 83 y.o. male that presents to the emergency department for evaluation of injury to right hand.  Patient  states that his wife turned on the wood splitter too quickly and the flat part crushed his finger.  He states that his finger "always looks a little crooked but may be a little more now."  He states that it does not need it to be straight and thinks it looks just fine a little bent.  He takes a baby aspirin daily.  He is unsure of last tetanus shot.  No numbness, tingling.  Past Medical History:  Diagnosis Date  . Chewing tobacco use   . Diverticulosis   . Hyperlipidemia   . Hypertension   . Indigestion     Patient Active Problem List   Diagnosis Date Noted  . Hyperlipemia 03/31/2015  . GERD (gastroesophageal reflux disease) 03/31/2015  . Chewing tobacco use     Past Surgical History:  Procedure Laterality Date  . FRACTURE SURGERY  2000   Hip, shoulder, knee  . VASECTOMY      Prior to Admission medications   Medication Sig Start Date End Date Taking? Authorizing Provider  cephALEXin (KEFLEX) 500 MG capsule Take 1 capsule (500 mg total) 4 (four) times daily for 10 days by mouth. 07/25/17 08/04/17  Enid DerryWagner, Jalysa Swopes, PA-C  hydrochlorothiazide (MICROZIDE) 12.5 MG capsule Take 1 capsule (12.5 mg total) by mouth daily. Patient taking differently: Take 12.5 mg by mouth at bedtime.  08/28/15   Donita BrooksPickard, Warren T, MD  meclizine (ANTIVERT) 25 MG tablet Take 1 tablet (25 mg total) by mouth 3 (three) times daily as needed for dizziness. 09/05/15   Stacy GardnerSeymore, Andrew, MD  metoprolol succinate (TOPROL XL) 25 MG 24 hr tablet Take 1 tablet (25 mg total) by mouth daily. 08/12/16   Almond LintIngal, Aileen, MD  Multiple Vitamin  (MULTIVITAMIN WITH MINERALS) TABS tablet Take 1 tablet by mouth daily.    [provider]  omeprazole (PRILOSEC) 20 MG capsule TAKE ONE CAPSULE BY MOUTH ONCE DAILY 09/15/15   Donita BrooksPickard, Warren T, MD  simvastatin (ZOCOR) 40 MG tablet TAKE ONE TABLET BY MOUTH ONCE DAILY 09/29/15   Donita BrooksPickard, Warren T, MD    Allergies Patient has no known allergies.  Family History  Problem Relation Age of Onset  . Stroke Father   . Depression Brother   . Diabetes Brother   . Heart disease Brother   . Hyperlipidemia Daughter   . Hypertension Daughter     Social History Social History   Tobacco Use  . Smoking status: Never Smoker  . Smokeless tobacco: Current User    Types: Chew  Substance Use Topics  . Alcohol use: No  . Drug use: No     Review of Systems  Constitutional: No fever/chills Cardiovascular: No chest pain. Respiratory:  No SOB. Gastrointestinal: No abdominal pain.  No nausea, no vomiting.  Skin: Negative for rash, ecchymosis. Positive for laceration. Neurological: Negative for headaches, numbness or tingling   ____________________________________________   PHYSICAL EXAM:  VITAL SIGNS: ED Triage Vitals  Enc Vitals Group     BP 07/25/17 1554 (!) 143/75     Pulse Rate 07/25/17 1554 86     Resp 07/25/17 1554 18     Temp 07/25/17  1554 98.3 F (36.8 C)     Temp Source 07/25/17 1554 Oral     SpO2 07/25/17 1554 94 %     Weight 07/25/17 1554 178 lb (80.7 kg)     Height 07/25/17 1554 5\' 7"  (1.702 m)     Head Circumference --      Peak Flow --      Pain Score 07/25/17 1601 3     Pain Loc --      Pain Edu? --      Excl. in GC? --      Constitutional: Alert and oriented. Well appearing and in no acute distress. Eyes: Conjunctivae are normal. PERRL. EOMI. Head: Atraumatic. ENT:      Ears:      Nose: No congestion/rhinnorhea.      Mouth/Throat: Mucous membranes are moist.  Neck: No stridor.   Cardiovascular: Normal rate, regular rhythm.  Good peripheral  circulation. Respiratory: Normal respiratory effort without tachypnea or retractions. Lungs CTAB. Good air entry to the bases with no decreased or absent breath sounds. Musculoskeletal: Full range of motion of PIP joint of right middle finger.  Unable to actively extend DIP joint of right middle finger.  Able to actively flex DIP joint of right middle finger.  Swan deformity. Neurologic:  Normal speech and language. No gross focal neurologic deficits are appreciated.  Skin:  Skin is warm, dry.  3 cm jagged laceration to distal middle finger.  Small portion of bone visible.  2, 1/4 cm lacerations to the palmar side of right distal middle finger.    ____________________________________________   LABS (all labs ordered are listed, but only abnormal results are displayed)  Labs Reviewed - No data to display ____________________________________________  EKG   ____________________________________________  RADIOLOGY Lexine Baton, personally viewed and evaluated these images (plain radiographs) as part of my medical decision making, as well as reviewing the written report by the radiologist.  Dg Finger Middle Right  Result Date: 07/25/2017 CLINICAL DATA:  Pt reported to have been working with the wood chipper when it slipped and cut his middle finger. Pt had a laceration at the tip of his finger and stated that he had difficulty bending his fingers. EXAM: RIGHT MIDDLE FINGER 2+V COMPARISON:  None. FINDINGS: There are comminuted fractures of the distal phalanx of the right middle finger. There fractures along the ulnar aspect of the distal tuft to the base of the distal phalanx. A small fractures noted along the radial corner of the base of the distal phalanx. There is also a small fracture from the dorsal base of the distal phalanx. There are associated soft tissue defects reflecting the lacerations. No radiopaque foreign body. The PIP joint is held in extension. The DIP joint is held in mild  flexion. Tendon injury should be considered. IMPRESSION: 1. Comminuted fractures of the distal phalanx of the right middle finger associated with soft tissue lacerations. No dislocation. No radiopaque foreign body. Electronically Signed   By: Amie Portland M.D.   On: 07/25/2017 17:22    ____________________________________________    PROCEDURES  Procedure(s) performed:    Procedures  LACERATION REPAIR Performed by: Enid Derry  Consent: Verbal consent obtained.  Consent given by: patient  Prepped and Draped in normal sterile fashion  Wound explored: No foreign bodies   Laceration Location: Right middle finger  Laceration Length: 3 cm, 1/4 cm, 1,4, cm  Anesthesia: None  Local anesthetic: lidocaine 1% without epinephrine  Anesthetic total: 6 ml  Irrigation method: syringe  Amount of cleaning: 500ml normal saline  Skin closure: 4-0 nylon  Number of sutures: 9, 2, 1  Technique: Simple interrupted  Patient tolerance: Patient tolerated the procedure well with no immediate complications.    Medications  cephALEXin (KEFLEX) capsule 500 mg (not administered)  Tdap (BOOSTRIX) injection 0.5 mL (0.5 mLs Intramuscular Given 07/25/17 1655)  lidocaine (PF) (XYLOCAINE) 1 % injection 5 mL (5 mLs Intradermal Given 07/25/17 1658)     ____________________________________________   INITIAL IMPRESSION / ASSESSMENT AND PLAN / ED COURSE  Pertinent labs & imaging results that were available during my care of the patient were reviewed by me and considered in my medical decision making (see chart for details).  Review of the [place] CSRS was performed in accordance of the [facility] prior to dispensing any controlled drugs.   Patient's diagnosis is consistent with finger laceration, tendon rupture, comminuted distal tuft fracture.  Vital signs and exam are reassuring.  X-ray consistent with comminuted fractures of the distal phalanx.  On exam, I am concerned for a full rupture of the  extensor tendon.  Dr. Allena KatzPatel was consulted and recommended that patient follow-up outpatient with hand surgery for tendon repair in 1-2 weeks.  Patient will call in the morning for an appointment.  Finger was soaked in normal saline and iodine.  Lacerations were repaired with sutures. Many sutures were placed to approximate the skin where tissue was destroyed.  Splint was placed.  He was instructed not to remove the splint, even for showering.  A dose of Keflex was given. Tetanus shot was updated. Patient will be discharged home with prescriptions for Keflex. Patient is to follow up with hand surgery as directed. Patient is given ED precautions to return to the ED for any worsening or new symptoms.     ____________________________________________  FINAL CLINICAL IMPRESSION(S) / ED DIAGNOSES  Final diagnoses:  Rupture of extensor tendon of right hand, initial encounter  Laceration of right middle finger without foreign body with damage to nail, initial encounter  Open displaced fracture of distal phalanx of right middle finger, initial encounter      NEW MEDICATIONS STARTED DURING THIS VISIT:      This chart was dictated using voice recognition software/Dragon. Despite best efforts to proofread, errors can occur which can change the meaning. Any change was purely unintentional.    Enid DerryWagner, Arely Tinner, PA-C 07/25/17 2313    Merrily Brittleifenbark, Neil, MD 07/26/17 [phone]

## 2017-07-26 DIAGNOSIS — S61212A Laceration without foreign body of right middle finger without damage to nail, initial encounter: Secondary | ICD-10-CM | POA: Diagnosis not present

## 2017-07-26 NOTE — Patient Outreach (Signed)
Outreach patient after ED visit [facility] at [facility].  I spoke with patient and verified PCP.  Explained to patient how to contact PCP after hours.  I then explained [facility] and 24 Hour Nurse Advice Line.  Patient stated that he does not have any transportation issues getting to and from his appointments.  He is in the process of scheduling a follow up appointment with PCP.  The patient did not want a follow up call just asked for information to be mailed and if services are needed in the future he would give a call.  Engagement tool was not completed.

## 2017-08-02 DIAGNOSIS — S61212A Laceration without foreign body of right middle finger without damage to nail, initial encounter: Secondary | ICD-10-CM | POA: Diagnosis not present

## 2017-08-09 DIAGNOSIS — R42 Dizziness and giddiness: Secondary | ICD-10-CM | POA: Diagnosis not present

## 2017-08-09 DIAGNOSIS — R413 Other amnesia: Secondary | ICD-10-CM | POA: Diagnosis not present

## 2017-08-09 DIAGNOSIS — R269 Unspecified abnormalities of gait and mobility: Secondary | ICD-10-CM | POA: Diagnosis not present

## 2017-08-09 DIAGNOSIS — S61212A Laceration without foreign body of right middle finger without damage to nail, initial encounter: Secondary | ICD-10-CM | POA: Diagnosis not present

## 2017-08-09 DIAGNOSIS — F5101 Primary insomnia: Secondary | ICD-10-CM | POA: Diagnosis not present

## 2017-08-17 DIAGNOSIS — H353231 Exudative age-related macular degeneration, bilateral, with active choroidal neovascularization: Secondary | ICD-10-CM | POA: Diagnosis not present

## 2017-08-17 DIAGNOSIS — H40003 Preglaucoma, unspecified, bilateral: Secondary | ICD-10-CM | POA: Diagnosis not present

## 2017-09-08 DIAGNOSIS — S61212A Laceration without foreign body of right middle finger without damage to nail, initial encounter: Secondary | ICD-10-CM | POA: Diagnosis not present

## 2017-09-10 IMAGING — MR MR HEAD WO/W CM
9 of 11 series · 36 of 48 positions shown · IV contrast (multihance)
Comparison: Comparison with prior CT from 09/05/2015.

CLINICAL DATA: Initial evaluation for two-month history of
dizziness, ringing and left the ear, hearing loss.

EXAM:
MRI HEAD WITHOUT AND WITH CONTRAST
TECHNIQUE: Multiplanar, multiecho pulse sequences of the brain and surrounding
structures were obtained without and with intravenous contrast. An
IAC protocol was utilized.
CONTRAST:  17mL MULTIHANCE GADOBENATE DIMEGLUMINE 529 MG/ML IV SOLN

[Series 2: T1 · sagittal · 5.0mm · 0.45mm/px · 3 of 29 slices shown (1 of 2)]
[im 1/29]
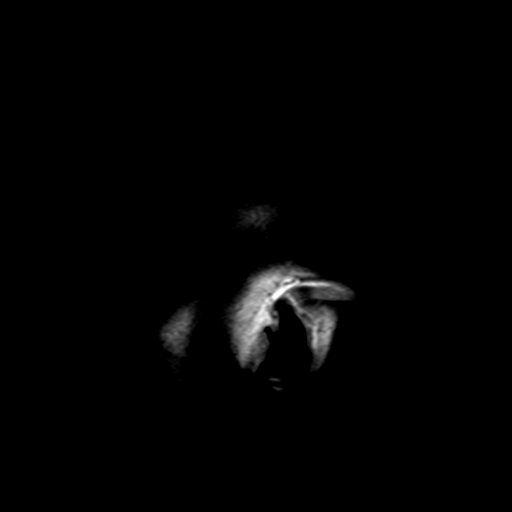
[im 15/29]
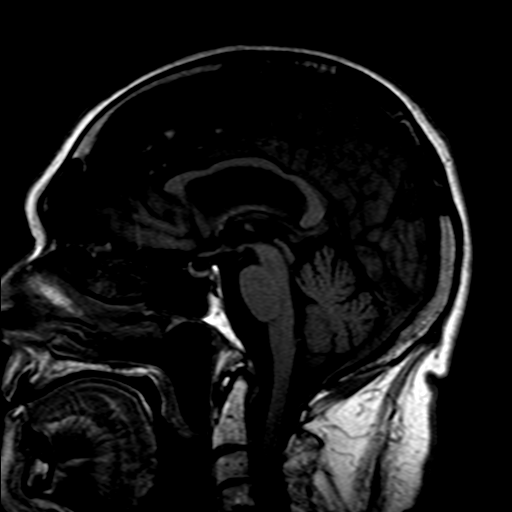
[im 29/29]
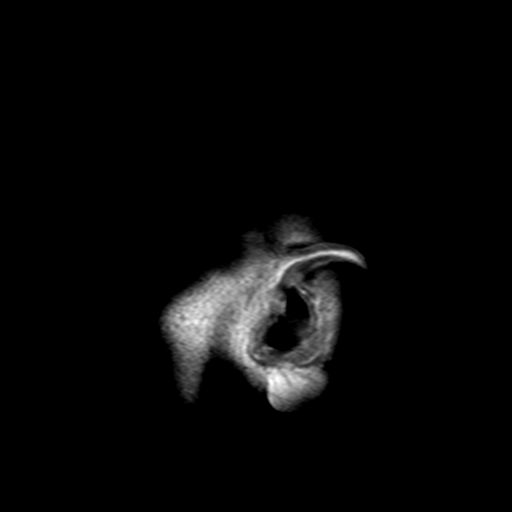

[Series 5: T2 · axial · 5.0mm · 0.45mm/px · z∈[-72,+94]mm · 3 of 27 slices shown]
[im 1/27]
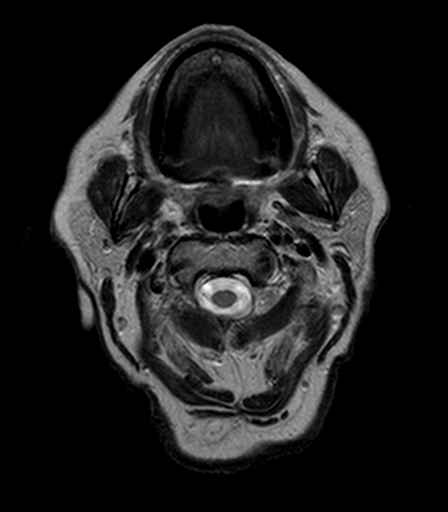
[im 14/27]
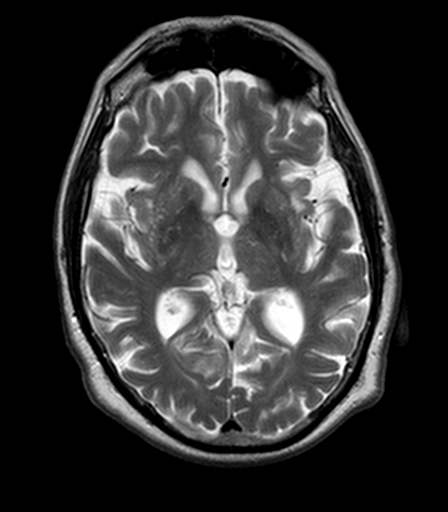
[im 27/27]
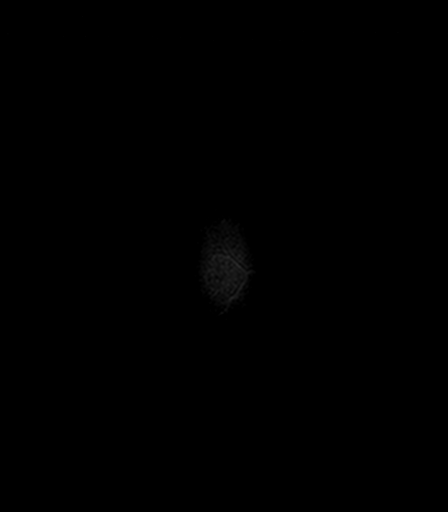

[Series 7: DWI · axial · 4.0mm · 0.94mm/px · z∈[-69,+100]mm · 5 of 44 slices shown (1 of 2)]
[im 1/44]
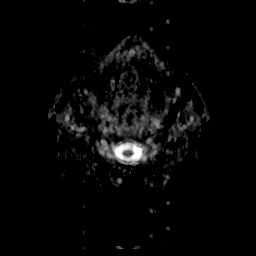
[im 11/44]
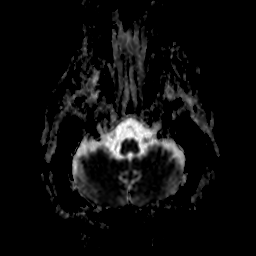
[im 22/44]
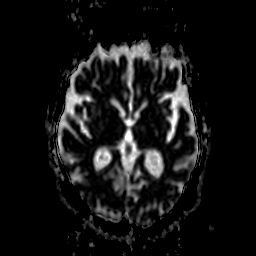
[im 33/44]
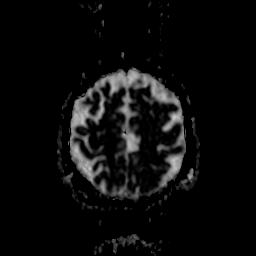
[im 44/44]
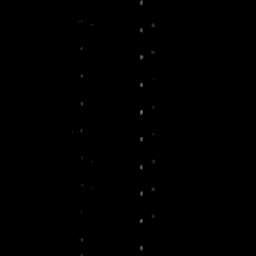

[Series 8: DWI · axial · 4.0mm · 0.94mm/px · z∈[-69,+96]mm · 5 of 43 slices shown (2 of 2)]
[im 1/43]
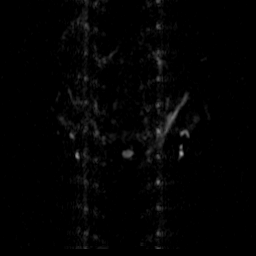
[im 11/43]
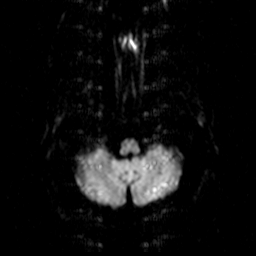
[im 22/43]
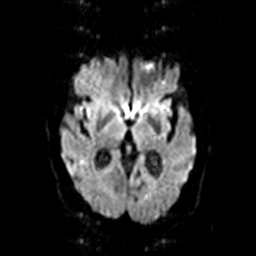
[im 32/43]
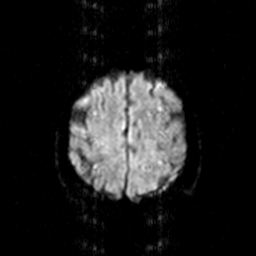
[im 43/43]
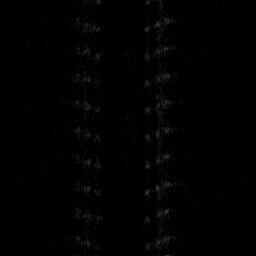

[Series 9: FLAIR · axial · 5.0mm · 0.90mm/px · z∈[-72,+94]mm · 4 of 27 slices shown]
[im 1/27]
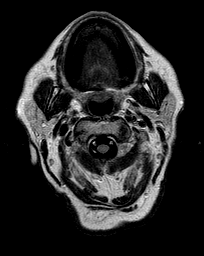
[im 9/27]
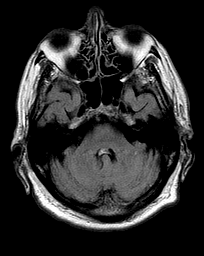
[im 18/27]
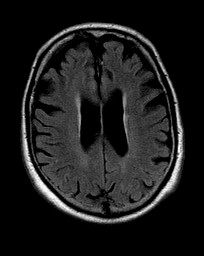
[im 27/27]
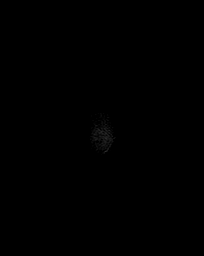

[Series 10: T1 · coronal · 3.0mm · 0.70mm/px · 2 of 19 slices shown (2 of 2)]
[im 1/19]
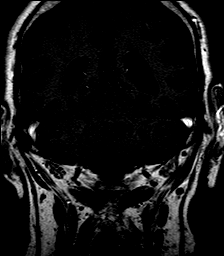
[im 10/19]
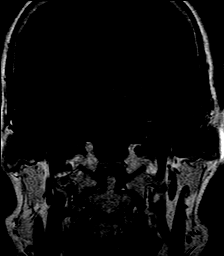

[Series 13: T1 post-contrast · coronal · 3.0mm · 0.70mm/px · 3 of 19 slices shown (1 of 3)]
[im 1/19]
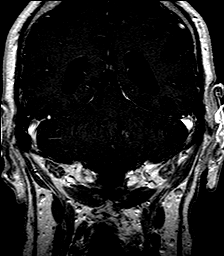
[im 10/19]
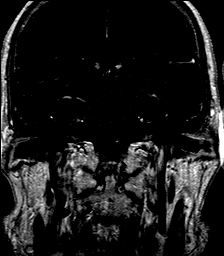
[im 19/19]
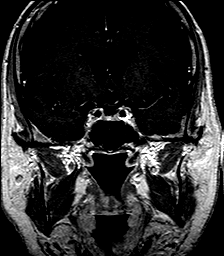

[Series 14: T1 post-contrast · axial · 3.0mm · 0.70mm/px · z∈[-55,-2]mm · 3 of 19 slices shown (2 of 3)]
[im 1/19]
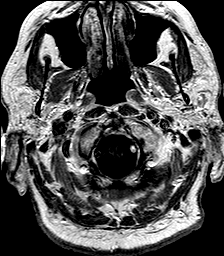
[im 10/19]
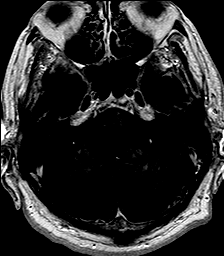
[im 19/19]
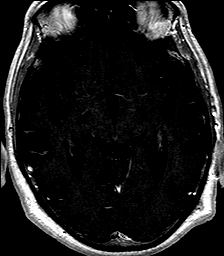

[Series 15: T1 post-contrast · axial · 3.0mm · 0.45mm/px · z∈[-64,+98]mm · 8 of 56 slices shown (3 of 3)]
[im 1/56]
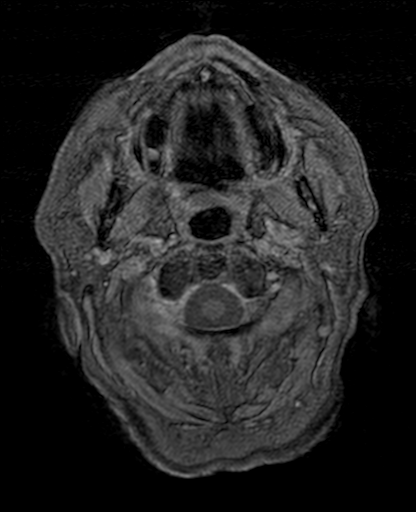
[im 8/56]
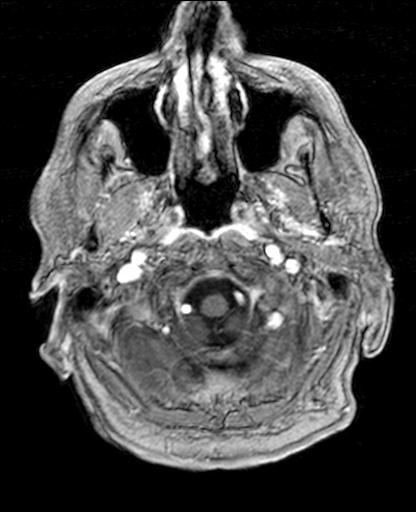
[im 16/56]
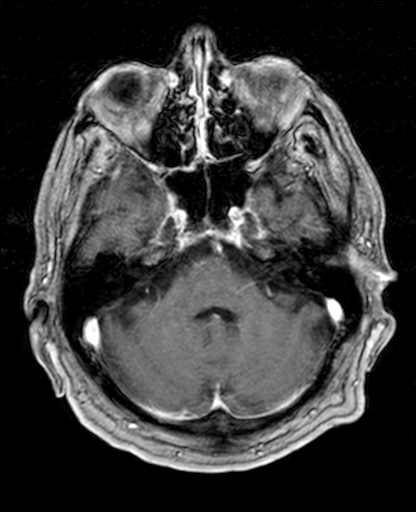
[im 24/56]
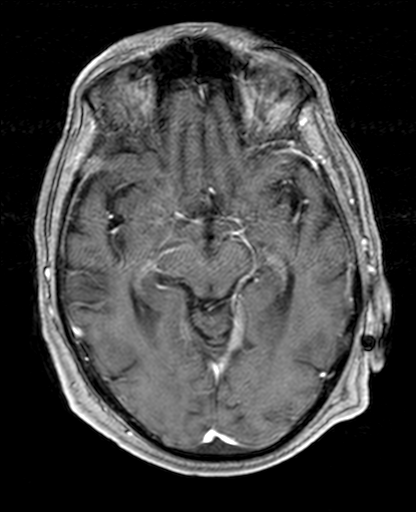
[im 32/56]
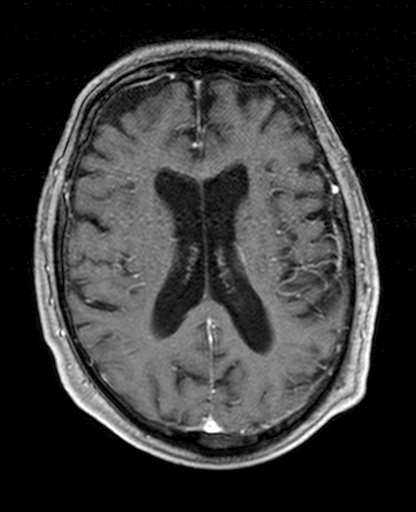
[im 40/56]
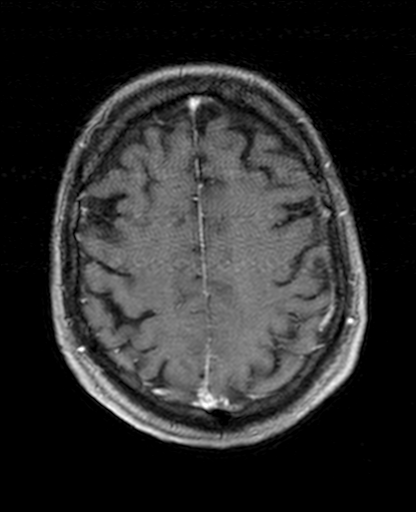
[im 48/56]
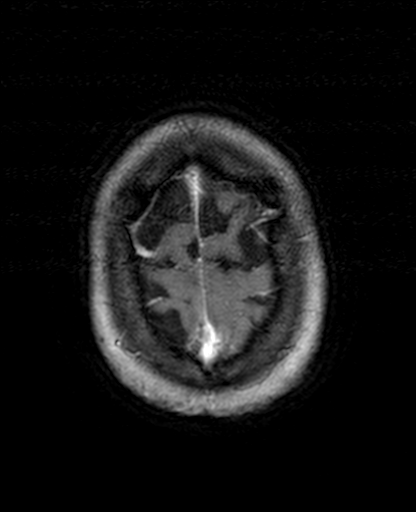
[im 56/56]
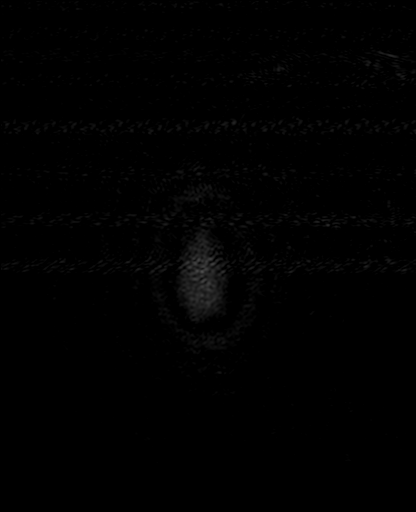

[36 of 48 positions shown; findings below may reference images not displayed]

FINDINGS: Brain: Mild diffuse prominence of the CSF containing spaces is
compatible with generalized age-related cerebral atrophy. Patchy and
confluent T2/FLAIR hyperintensity within the periventricular, deep,
and subcortical white matter both cerebral hemispheres present, most
likely related to chronic microvascular ischemic disease, mild for
age.

Axial DWI sequence somewhat limited by artifact. No convincing foci
of restricted diffusion to suggest acute or subacute ischemia.
Gray-white matter differentiation maintained. No areas of
encephalomalacia to suggest chronic infarction.

No mass lesion, midline shift, or mass effect. Mild ventricular
prominence related to global parenchymal volume loss without
hydrocephalus. No extra-axial fluid collection. Major dural sinuses
are grossly patent. No abnormal enhancement within the brain.

Pituitary gland within normal limits. Midline structures intact and
normal.

Dedicated thin section images through the internal auditory canals
were performed. No mass lesion seen within the cerebellopontine
angle cisterns. Seventh and eighth cranial nerves are seen coursing
normally into the internal auditory canals. Inner ear structures
including the vestibules, cochlea, and semi circular canals are
within normal limits and symmetric bilaterally. Normal post
ganglionic enhancement within the facial nerves bilaterally. No
abnormal enhancement identified. Asymmetric T1/T2 signal intensity
within the inferior left mastoid air cells favored to reflect
asymmetric marrow. No significant mastoid effusion. Right mastoid
air cells are clear.

Vascular: Major intracranial vascular flow voids are preserved.
Probable atheromatous plaque noted within the proximal left V4
segment (series 5, image 4).

Skull and upper cervical spine: Craniocervical junction within
normal limits. Mild degenerative spondylolysis noted within the
upper cervical spine without significant stenosis. Bone marrow
signal intensity normal. No scalp soft tissue abnormality.

Sinuses/Orbits: Globes and orbital soft tissues demonstrate no acute
abnormality. Patient is status post lens extraction on the left.
Mild scattered mucosal thickening within the ethmoidal air cells.
Paranasal sinuses are otherwise clear.
IMPRESSION: 1. Normal IAC protocol MRI of the brain. No structural findings to
explain patient's symptoms identified.
2. Mild age-related cerebral atrophy with chronic microvascular
ischemic disease.

## 2017-09-22 DIAGNOSIS — H353211 Exudative age-related macular degeneration, right eye, with active choroidal neovascularization: Secondary | ICD-10-CM | POA: Diagnosis not present

## 2017-09-22 DIAGNOSIS — H353231 Exudative age-related macular degeneration, bilateral, with active choroidal neovascularization: Secondary | ICD-10-CM | POA: Diagnosis not present

## 2017-09-22 DIAGNOSIS — H353221 Exudative age-related macular degeneration, left eye, with active choroidal neovascularization: Secondary | ICD-10-CM | POA: Diagnosis not present

## 2017-09-29 DIAGNOSIS — E7800 Pure hypercholesterolemia, unspecified: Secondary | ICD-10-CM | POA: Diagnosis not present

## 2017-09-29 DIAGNOSIS — H8110 Benign paroxysmal vertigo, unspecified ear: Secondary | ICD-10-CM | POA: Diagnosis not present

## 2017-09-29 DIAGNOSIS — R7309 Other abnormal glucose: Secondary | ICD-10-CM | POA: Diagnosis not present

## 2017-09-29 DIAGNOSIS — I10 Essential (primary) hypertension: Secondary | ICD-10-CM | POA: Diagnosis not present

## 2017-09-29 DIAGNOSIS — Z125 Encounter for screening for malignant neoplasm of prostate: Secondary | ICD-10-CM | POA: Diagnosis not present

## 2017-09-29 DIAGNOSIS — G20 Parkinson's disease: Secondary | ICD-10-CM | POA: Diagnosis not present

## 2017-09-29 DIAGNOSIS — Z79899 Other long term (current) drug therapy: Secondary | ICD-10-CM | POA: Diagnosis not present

## 2017-10-27 DIAGNOSIS — H353221 Exudative age-related macular degeneration, left eye, with active choroidal neovascularization: Secondary | ICD-10-CM | POA: Diagnosis not present

## 2017-10-27 DIAGNOSIS — H353231 Exudative age-related macular degeneration, bilateral, with active choroidal neovascularization: Secondary | ICD-10-CM | POA: Diagnosis not present

## 2017-10-27 DIAGNOSIS — H353211 Exudative age-related macular degeneration, right eye, with active choroidal neovascularization: Secondary | ICD-10-CM | POA: Diagnosis not present

## 2017-11-21 DIAGNOSIS — R42 Dizziness and giddiness: Secondary | ICD-10-CM | POA: Diagnosis not present

## 2017-11-21 DIAGNOSIS — F0280 Dementia in other diseases classified elsewhere without behavioral disturbance: Secondary | ICD-10-CM | POA: Diagnosis not present

## 2017-11-21 DIAGNOSIS — F5101 Primary insomnia: Secondary | ICD-10-CM | POA: Diagnosis not present

## 2017-11-21 DIAGNOSIS — R269 Unspecified abnormalities of gait and mobility: Secondary | ICD-10-CM | POA: Diagnosis not present

## 2017-11-21 DIAGNOSIS — G309 Alzheimer's disease, unspecified: Secondary | ICD-10-CM | POA: Diagnosis not present

## 2017-11-21 DIAGNOSIS — F0150 Vascular dementia without behavioral disturbance: Secondary | ICD-10-CM | POA: Diagnosis not present

## 2017-12-01 DIAGNOSIS — H353231 Exudative age-related macular degeneration, bilateral, with active choroidal neovascularization: Secondary | ICD-10-CM | POA: Diagnosis not present

## 2017-12-29 DIAGNOSIS — I10 Essential (primary) hypertension: Secondary | ICD-10-CM | POA: Diagnosis not present

## 2017-12-29 DIAGNOSIS — R7309 Other abnormal glucose: Secondary | ICD-10-CM | POA: Diagnosis not present

## 2017-12-29 DIAGNOSIS — E7800 Pure hypercholesterolemia, unspecified: Secondary | ICD-10-CM | POA: Diagnosis not present

## 2017-12-29 DIAGNOSIS — Z125 Encounter for screening for malignant neoplasm of prostate: Secondary | ICD-10-CM | POA: Diagnosis not present

## 2017-12-29 DIAGNOSIS — Z79899 Other long term (current) drug therapy: Secondary | ICD-10-CM | POA: Diagnosis not present

## 2018-01-05 DIAGNOSIS — Z0000 Encounter for general adult medical examination without abnormal findings: Secondary | ICD-10-CM | POA: Diagnosis not present

## 2018-01-05 DIAGNOSIS — G20 Parkinson's disease: Secondary | ICD-10-CM | POA: Diagnosis not present

## 2018-01-05 DIAGNOSIS — H8113 Benign paroxysmal vertigo, bilateral: Secondary | ICD-10-CM | POA: Diagnosis not present

## 2018-01-05 DIAGNOSIS — I10 Essential (primary) hypertension: Secondary | ICD-10-CM | POA: Diagnosis not present

## 2018-01-05 DIAGNOSIS — E7800 Pure hypercholesterolemia, unspecified: Secondary | ICD-10-CM | POA: Diagnosis not present

## 2018-01-12 DIAGNOSIS — H353231 Exudative age-related macular degeneration, bilateral, with active choroidal neovascularization: Secondary | ICD-10-CM | POA: Diagnosis not present

## 2018-01-24 DIAGNOSIS — H401131 Primary open-angle glaucoma, bilateral, mild stage: Secondary | ICD-10-CM | POA: Diagnosis not present

## 2018-02-16 DIAGNOSIS — H353211 Exudative age-related macular degeneration, right eye, with active choroidal neovascularization: Secondary | ICD-10-CM | POA: Diagnosis not present

## 2018-02-16 DIAGNOSIS — H353221 Exudative age-related macular degeneration, left eye, with active choroidal neovascularization: Secondary | ICD-10-CM | POA: Diagnosis not present

## 2018-02-16 DIAGNOSIS — H353231 Exudative age-related macular degeneration, bilateral, with active choroidal neovascularization: Secondary | ICD-10-CM | POA: Diagnosis not present

## 2018-03-23 DIAGNOSIS — H353213 Exudative age-related macular degeneration, right eye, with inactive scar: Secondary | ICD-10-CM | POA: Diagnosis not present

## 2018-03-27 DIAGNOSIS — R42 Dizziness and giddiness: Secondary | ICD-10-CM | POA: Diagnosis not present

## 2018-03-29 DIAGNOSIS — R42 Dizziness and giddiness: Secondary | ICD-10-CM | POA: Diagnosis not present

## 2018-03-29 DIAGNOSIS — I6523 Occlusion and stenosis of bilateral carotid arteries: Secondary | ICD-10-CM | POA: Diagnosis not present

## 2018-04-07 DIAGNOSIS — R7309 Other abnormal glucose: Secondary | ICD-10-CM | POA: Diagnosis not present

## 2018-04-07 DIAGNOSIS — E7800 Pure hypercholesterolemia, unspecified: Secondary | ICD-10-CM | POA: Diagnosis not present

## 2018-04-07 DIAGNOSIS — H8113 Benign paroxysmal vertigo, bilateral: Secondary | ICD-10-CM | POA: Diagnosis not present

## 2018-04-07 DIAGNOSIS — G20 Parkinson's disease: Secondary | ICD-10-CM | POA: Diagnosis not present

## 2018-04-07 DIAGNOSIS — Z79899 Other long term (current) drug therapy: Secondary | ICD-10-CM | POA: Diagnosis not present

## 2018-04-07 DIAGNOSIS — I10 Essential (primary) hypertension: Secondary | ICD-10-CM | POA: Diagnosis not present

## 2018-04-24 DIAGNOSIS — H353221 Exudative age-related macular degeneration, left eye, with active choroidal neovascularization: Secondary | ICD-10-CM | POA: Diagnosis not present

## 2018-04-24 DIAGNOSIS — H353211 Exudative age-related macular degeneration, right eye, with active choroidal neovascularization: Secondary | ICD-10-CM | POA: Diagnosis not present

## 2018-05-24 DIAGNOSIS — F0150 Vascular dementia without behavioral disturbance: Secondary | ICD-10-CM | POA: Diagnosis not present

## 2018-05-24 DIAGNOSIS — R42 Dizziness and giddiness: Secondary | ICD-10-CM | POA: Diagnosis not present

## 2018-05-24 DIAGNOSIS — R269 Unspecified abnormalities of gait and mobility: Secondary | ICD-10-CM | POA: Diagnosis not present

## 2018-05-24 DIAGNOSIS — G309 Alzheimer's disease, unspecified: Secondary | ICD-10-CM | POA: Diagnosis not present

## 2018-05-24 DIAGNOSIS — F0280 Dementia in other diseases classified elsewhere without behavioral disturbance: Secondary | ICD-10-CM | POA: Diagnosis not present

## 2018-05-26 DIAGNOSIS — H353231 Exudative age-related macular degeneration, bilateral, with active choroidal neovascularization: Secondary | ICD-10-CM | POA: Diagnosis not present

## 2018-06-30 DIAGNOSIS — H353231 Exudative age-related macular degeneration, bilateral, with active choroidal neovascularization: Secondary | ICD-10-CM | POA: Diagnosis not present

## 2018-07-10 DIAGNOSIS — Z79899 Other long term (current) drug therapy: Secondary | ICD-10-CM | POA: Diagnosis not present

## 2018-07-10 DIAGNOSIS — G20 Parkinson's disease: Secondary | ICD-10-CM | POA: Diagnosis not present

## 2018-07-10 DIAGNOSIS — R42 Dizziness and giddiness: Secondary | ICD-10-CM | POA: Diagnosis not present

## 2018-07-10 DIAGNOSIS — H8113 Benign paroxysmal vertigo, bilateral: Secondary | ICD-10-CM | POA: Diagnosis not present

## 2018-07-10 DIAGNOSIS — I10 Essential (primary) hypertension: Secondary | ICD-10-CM | POA: Diagnosis not present

## 2018-07-10 DIAGNOSIS — E7800 Pure hypercholesterolemia, unspecified: Secondary | ICD-10-CM | POA: Diagnosis not present

## 2018-07-10 DIAGNOSIS — R7309 Other abnormal glucose: Secondary | ICD-10-CM | POA: Diagnosis not present

## 2018-07-28 DIAGNOSIS — H401131 Primary open-angle glaucoma, bilateral, mild stage: Secondary | ICD-10-CM | POA: Diagnosis not present

## 2018-08-09 DIAGNOSIS — H353231 Exudative age-related macular degeneration, bilateral, with active choroidal neovascularization: Secondary | ICD-10-CM | POA: Diagnosis not present

## 2018-09-13 DIAGNOSIS — H353231 Exudative age-related macular degeneration, bilateral, with active choroidal neovascularization: Secondary | ICD-10-CM | POA: Diagnosis not present

## 2018-09-14 DIAGNOSIS — H2511 Age-related nuclear cataract, right eye: Secondary | ICD-10-CM | POA: Diagnosis not present

## 2018-09-25 DIAGNOSIS — H25811 Combined forms of age-related cataract, right eye: Secondary | ICD-10-CM | POA: Diagnosis not present

## 2018-09-25 DIAGNOSIS — H2511 Age-related nuclear cataract, right eye: Secondary | ICD-10-CM | POA: Diagnosis not present

## 2018-09-27 DIAGNOSIS — Z09 Encounter for follow-up examination after completed treatment for conditions other than malignant neoplasm: Secondary | ICD-10-CM | POA: Diagnosis not present

## 2018-10-10 DIAGNOSIS — E7800 Pure hypercholesterolemia, unspecified: Secondary | ICD-10-CM | POA: Diagnosis not present

## 2018-10-10 DIAGNOSIS — Z125 Encounter for screening for malignant neoplasm of prostate: Secondary | ICD-10-CM | POA: Diagnosis not present

## 2018-10-10 DIAGNOSIS — Z79899 Other long term (current) drug therapy: Secondary | ICD-10-CM | POA: Diagnosis not present

## 2018-10-10 DIAGNOSIS — I10 Essential (primary) hypertension: Secondary | ICD-10-CM | POA: Diagnosis not present

## 2018-10-10 DIAGNOSIS — Z0000 Encounter for general adult medical examination without abnormal findings: Secondary | ICD-10-CM | POA: Diagnosis not present

## 2018-10-10 DIAGNOSIS — R040 Epistaxis: Secondary | ICD-10-CM | POA: Diagnosis not present

## 2018-10-10 DIAGNOSIS — G20 Parkinson's disease: Secondary | ICD-10-CM | POA: Diagnosis not present

## 2018-10-10 DIAGNOSIS — R7309 Other abnormal glucose: Secondary | ICD-10-CM | POA: Diagnosis not present

## 2018-10-12 DIAGNOSIS — F0280 Dementia in other diseases classified elsewhere without behavioral disturbance: Secondary | ICD-10-CM | POA: Diagnosis not present

## 2018-10-12 DIAGNOSIS — F0150 Vascular dementia without behavioral disturbance: Secondary | ICD-10-CM | POA: Diagnosis not present

## 2018-10-12 DIAGNOSIS — R42 Dizziness and giddiness: Secondary | ICD-10-CM | POA: Diagnosis not present

## 2018-10-12 DIAGNOSIS — G309 Alzheimer's disease, unspecified: Secondary | ICD-10-CM | POA: Diagnosis not present

## 2018-10-13 DIAGNOSIS — R040 Epistaxis: Secondary | ICD-10-CM | POA: Diagnosis not present

## 2018-10-25 DIAGNOSIS — H353211 Exudative age-related macular degeneration, right eye, with active choroidal neovascularization: Secondary | ICD-10-CM | POA: Diagnosis not present

## 2018-10-25 DIAGNOSIS — H353222 Exudative age-related macular degeneration, left eye, with inactive choroidal neovascularization: Secondary | ICD-10-CM | POA: Diagnosis not present

## 2018-12-11 DIAGNOSIS — H353211 Exudative age-related macular degeneration, right eye, with active choroidal neovascularization: Secondary | ICD-10-CM | POA: Diagnosis not present

## 2019-01-10 DIAGNOSIS — E7800 Pure hypercholesterolemia, unspecified: Secondary | ICD-10-CM | POA: Diagnosis not present

## 2019-01-10 DIAGNOSIS — R7309 Other abnormal glucose: Secondary | ICD-10-CM | POA: Diagnosis not present

## 2019-01-10 DIAGNOSIS — Z79899 Other long term (current) drug therapy: Secondary | ICD-10-CM | POA: Diagnosis not present

## 2019-01-10 DIAGNOSIS — I10 Essential (primary) hypertension: Secondary | ICD-10-CM | POA: Diagnosis not present

## 2019-01-10 DIAGNOSIS — Z125 Encounter for screening for malignant neoplasm of prostate: Secondary | ICD-10-CM | POA: Diagnosis not present

## 2019-01-17 DIAGNOSIS — E7800 Pure hypercholesterolemia, unspecified: Secondary | ICD-10-CM | POA: Diagnosis not present

## 2019-01-17 DIAGNOSIS — I10 Essential (primary) hypertension: Secondary | ICD-10-CM | POA: Diagnosis not present

## 2019-01-17 DIAGNOSIS — G20 Parkinson's disease: Secondary | ICD-10-CM | POA: Diagnosis not present

## 2019-01-17 DIAGNOSIS — R42 Dizziness and giddiness: Secondary | ICD-10-CM | POA: Diagnosis not present

## 2019-01-17 DIAGNOSIS — Z0000 Encounter for general adult medical examination without abnormal findings: Secondary | ICD-10-CM | POA: Diagnosis not present

## 2019-01-17 DIAGNOSIS — H8113 Benign paroxysmal vertigo, bilateral: Secondary | ICD-10-CM | POA: Diagnosis not present

## 2019-01-22 DIAGNOSIS — H353211 Exudative age-related macular degeneration, right eye, with active choroidal neovascularization: Secondary | ICD-10-CM | POA: Diagnosis not present

## 2019-02-15 DIAGNOSIS — G309 Alzheimer's disease, unspecified: Secondary | ICD-10-CM | POA: Diagnosis not present

## 2019-02-15 DIAGNOSIS — F0280 Dementia in other diseases classified elsewhere without behavioral disturbance: Secondary | ICD-10-CM | POA: Diagnosis not present

## 2019-02-15 DIAGNOSIS — R269 Unspecified abnormalities of gait and mobility: Secondary | ICD-10-CM | POA: Diagnosis not present

## 2019-02-15 DIAGNOSIS — R42 Dizziness and giddiness: Secondary | ICD-10-CM | POA: Diagnosis not present

## 2019-02-15 DIAGNOSIS — F5101 Primary insomnia: Secondary | ICD-10-CM | POA: Diagnosis not present

## 2019-02-15 DIAGNOSIS — F0150 Vascular dementia without behavioral disturbance: Secondary | ICD-10-CM | POA: Diagnosis not present

## 2019-02-28 DIAGNOSIS — H353211 Exudative age-related macular degeneration, right eye, with active choroidal neovascularization: Secondary | ICD-10-CM | POA: Diagnosis not present

## 2019-04-04 ENCOUNTER — Other Ambulatory Visit: Payer: Self-pay

## 2019-04-04 DIAGNOSIS — H353211 Exudative age-related macular degeneration, right eye, with active choroidal neovascularization: Secondary | ICD-10-CM | POA: Diagnosis not present

## 2019-04-20 DIAGNOSIS — K219 Gastro-esophageal reflux disease without esophagitis: Secondary | ICD-10-CM | POA: Diagnosis not present

## 2019-04-20 DIAGNOSIS — E7800 Pure hypercholesterolemia, unspecified: Secondary | ICD-10-CM | POA: Diagnosis not present

## 2019-04-20 DIAGNOSIS — G20 Parkinson's disease: Secondary | ICD-10-CM | POA: Diagnosis not present

## 2019-04-20 DIAGNOSIS — R7309 Other abnormal glucose: Secondary | ICD-10-CM | POA: Diagnosis not present

## 2019-04-20 DIAGNOSIS — M25561 Pain in right knee: Secondary | ICD-10-CM | POA: Diagnosis not present

## 2019-04-20 DIAGNOSIS — I10 Essential (primary) hypertension: Secondary | ICD-10-CM | POA: Diagnosis not present

## 2019-04-20 DIAGNOSIS — Z79899 Other long term (current) drug therapy: Secondary | ICD-10-CM | POA: Diagnosis not present

## 2019-04-20 DIAGNOSIS — G8929 Other chronic pain: Secondary | ICD-10-CM | POA: Diagnosis not present

## 2019-04-20 DIAGNOSIS — M25562 Pain in left knee: Secondary | ICD-10-CM | POA: Diagnosis not present

## 2019-04-26 DIAGNOSIS — M25512 Pain in left shoulder: Secondary | ICD-10-CM | POA: Diagnosis not present

## 2019-04-26 DIAGNOSIS — Y92009 Unspecified place in unspecified non-institutional (private) residence as the place of occurrence of the external cause: Secondary | ICD-10-CM | POA: Diagnosis not present

## 2019-04-26 DIAGNOSIS — W010XXA Fall on same level from slipping, tripping and stumbling without subsequent striking against object, initial encounter: Secondary | ICD-10-CM | POA: Diagnosis not present

## 2019-04-26 DIAGNOSIS — S4992XA Unspecified injury of left shoulder and upper arm, initial encounter: Secondary | ICD-10-CM | POA: Diagnosis not present

## 2019-04-26 DIAGNOSIS — H401131 Primary open-angle glaucoma, bilateral, mild stage: Secondary | ICD-10-CM | POA: Diagnosis not present

## 2019-05-09 DIAGNOSIS — H353211 Exudative age-related macular degeneration, right eye, with active choroidal neovascularization: Secondary | ICD-10-CM | POA: Diagnosis not present

## 2019-05-16 DIAGNOSIS — M25562 Pain in left knee: Secondary | ICD-10-CM | POA: Diagnosis not present

## 2019-05-16 DIAGNOSIS — M25561 Pain in right knee: Secondary | ICD-10-CM | POA: Diagnosis not present

## 2019-06-13 DIAGNOSIS — G20 Parkinson's disease: Secondary | ICD-10-CM | POA: Diagnosis not present

## 2019-06-13 DIAGNOSIS — F0281 Dementia in other diseases classified elsewhere with behavioral disturbance: Secondary | ICD-10-CM | POA: Diagnosis not present

## 2019-06-13 DIAGNOSIS — R4182 Altered mental status, unspecified: Secondary | ICD-10-CM | POA: Diagnosis not present

## 2019-06-13 DIAGNOSIS — Z23 Encounter for immunization: Secondary | ICD-10-CM | POA: Diagnosis not present

## 2019-06-14 ENCOUNTER — Other Ambulatory Visit: Payer: Self-pay | Admitting: Internal Medicine

## 2019-06-14 DIAGNOSIS — R4182 Altered mental status, unspecified: Secondary | ICD-10-CM

## 2019-06-20 DIAGNOSIS — H353211 Exudative age-related macular degeneration, right eye, with active choroidal neovascularization: Secondary | ICD-10-CM | POA: Diagnosis not present

## 2019-06-26 ENCOUNTER — Ambulatory Visit
Admission: RE | Admit: 2019-06-26 | Discharge: 2019-06-26 | Disposition: A | Discharge status: Home / Self Care | Payer: PPO | Source: Ambulatory Visit | Attending: Internal Medicine | Admitting: Internal Medicine

## 2019-06-26 ENCOUNTER — Other Ambulatory Visit: Payer: Self-pay

## 2019-06-26 DIAGNOSIS — R4182 Altered mental status, unspecified: Secondary | ICD-10-CM | POA: Insufficient documentation

## 2019-07-12 DIAGNOSIS — R519 Headache, unspecified: Secondary | ICD-10-CM | POA: Diagnosis not present

## 2019-07-12 DIAGNOSIS — J019 Acute sinusitis, unspecified: Secondary | ICD-10-CM | POA: Diagnosis not present

## 2019-07-12 DIAGNOSIS — G20 Parkinson's disease: Secondary | ICD-10-CM | POA: Diagnosis not present

## 2019-07-13 DIAGNOSIS — Z79899 Other long term (current) drug therapy: Secondary | ICD-10-CM | POA: Diagnosis not present

## 2019-07-13 DIAGNOSIS — I10 Essential (primary) hypertension: Secondary | ICD-10-CM | POA: Diagnosis not present

## 2019-07-13 DIAGNOSIS — F0281 Dementia in other diseases classified elsewhere with behavioral disturbance: Secondary | ICD-10-CM | POA: Diagnosis not present

## 2019-07-13 DIAGNOSIS — R7309 Other abnormal glucose: Secondary | ICD-10-CM | POA: Diagnosis not present

## 2019-07-13 DIAGNOSIS — E7800 Pure hypercholesterolemia, unspecified: Secondary | ICD-10-CM | POA: Diagnosis not present

## 2019-07-13 DIAGNOSIS — G20 Parkinson's disease: Secondary | ICD-10-CM | POA: Diagnosis not present

## 2019-07-20 DIAGNOSIS — I10 Essential (primary) hypertension: Secondary | ICD-10-CM | POA: Diagnosis not present

## 2019-07-20 DIAGNOSIS — E7800 Pure hypercholesterolemia, unspecified: Secondary | ICD-10-CM | POA: Diagnosis not present

## 2019-07-20 DIAGNOSIS — F0281 Dementia in other diseases classified elsewhere with behavioral disturbance: Secondary | ICD-10-CM | POA: Diagnosis not present

## 2019-07-20 DIAGNOSIS — G20 Parkinson's disease: Secondary | ICD-10-CM | POA: Diagnosis not present

## 2019-07-20 DIAGNOSIS — M25512 Pain in left shoulder: Secondary | ICD-10-CM | POA: Diagnosis not present

## 2019-07-25 DIAGNOSIS — H353211 Exudative age-related macular degeneration, right eye, with active choroidal neovascularization: Secondary | ICD-10-CM | POA: Diagnosis not present

## 2019-08-17 DIAGNOSIS — M7582 Other shoulder lesions, left shoulder: Secondary | ICD-10-CM | POA: Diagnosis not present

## 2019-09-05 ENCOUNTER — Ambulatory Visit: Payer: PPO | Attending: Internal Medicine

## 2019-09-06 DIAGNOSIS — H353211 Exudative age-related macular degeneration, right eye, with active choroidal neovascularization: Secondary | ICD-10-CM | POA: Diagnosis not present

## 2019-10-18 DIAGNOSIS — R7309 Other abnormal glucose: Secondary | ICD-10-CM | POA: Diagnosis not present

## 2019-10-18 DIAGNOSIS — E7800 Pure hypercholesterolemia, unspecified: Secondary | ICD-10-CM | POA: Diagnosis not present

## 2019-10-18 DIAGNOSIS — I10 Essential (primary) hypertension: Secondary | ICD-10-CM | POA: Diagnosis not present

## 2019-10-18 DIAGNOSIS — G20 Parkinson's disease: Secondary | ICD-10-CM | POA: Diagnosis not present

## 2019-10-18 DIAGNOSIS — Z125 Encounter for screening for malignant neoplasm of prostate: Secondary | ICD-10-CM | POA: Diagnosis not present

## 2019-10-18 DIAGNOSIS — Z79899 Other long term (current) drug therapy: Secondary | ICD-10-CM | POA: Diagnosis not present

## 2019-10-24 DIAGNOSIS — H353222 Exudative age-related macular degeneration, left eye, with inactive choroidal neovascularization: Secondary | ICD-10-CM | POA: Diagnosis not present

## 2019-11-22 DIAGNOSIS — F0280 Dementia in other diseases classified elsewhere without behavioral disturbance: Secondary | ICD-10-CM | POA: Diagnosis not present

## 2019-11-22 DIAGNOSIS — R269 Unspecified abnormalities of gait and mobility: Secondary | ICD-10-CM | POA: Diagnosis not present

## 2019-11-22 DIAGNOSIS — F0150 Vascular dementia without behavioral disturbance: Secondary | ICD-10-CM | POA: Diagnosis not present

## 2019-11-22 DIAGNOSIS — G309 Alzheimer's disease, unspecified: Secondary | ICD-10-CM | POA: Diagnosis not present

## 2019-11-22 DIAGNOSIS — R42 Dizziness and giddiness: Secondary | ICD-10-CM | POA: Diagnosis not present

## 2019-11-28 DIAGNOSIS — H401131 Primary open-angle glaucoma, bilateral, mild stage: Secondary | ICD-10-CM | POA: Diagnosis not present

## 2019-12-05 DIAGNOSIS — H353222 Exudative age-related macular degeneration, left eye, with inactive choroidal neovascularization: Secondary | ICD-10-CM | POA: Diagnosis not present

## 2019-12-05 DIAGNOSIS — H353211 Exudative age-related macular degeneration, right eye, with active choroidal neovascularization: Secondary | ICD-10-CM | POA: Diagnosis not present

## 2019-12-05 DIAGNOSIS — H401131 Primary open-angle glaucoma, bilateral, mild stage: Secondary | ICD-10-CM | POA: Diagnosis not present

## 2019-12-20 DIAGNOSIS — M1712 Unilateral primary osteoarthritis, left knee: Secondary | ICD-10-CM | POA: Diagnosis not present

## 2019-12-20 DIAGNOSIS — Z9181 History of falling: Secondary | ICD-10-CM | POA: Diagnosis not present

## 2019-12-20 DIAGNOSIS — F0150 Vascular dementia without behavioral disturbance: Secondary | ICD-10-CM | POA: Diagnosis not present

## 2019-12-20 DIAGNOSIS — I10 Essential (primary) hypertension: Secondary | ICD-10-CM | POA: Diagnosis not present

## 2019-12-20 DIAGNOSIS — G20 Parkinson's disease: Secondary | ICD-10-CM | POA: Diagnosis not present

## 2019-12-20 DIAGNOSIS — K5790 Diverticulosis of intestine, part unspecified, without perforation or abscess without bleeding: Secondary | ICD-10-CM | POA: Diagnosis not present

## 2019-12-20 DIAGNOSIS — K219 Gastro-esophageal reflux disease without esophagitis: Secondary | ICD-10-CM | POA: Diagnosis not present

## 2019-12-20 DIAGNOSIS — G4700 Insomnia, unspecified: Secondary | ICD-10-CM | POA: Diagnosis not present

## 2019-12-20 DIAGNOSIS — H25012 Cortical age-related cataract, left eye: Secondary | ICD-10-CM | POA: Diagnosis not present

## 2019-12-20 DIAGNOSIS — H3530 Unspecified macular degeneration: Secondary | ICD-10-CM | POA: Diagnosis not present

## 2019-12-20 DIAGNOSIS — F0280 Dementia in other diseases classified elsewhere without behavioral disturbance: Secondary | ICD-10-CM | POA: Diagnosis not present

## 2019-12-20 DIAGNOSIS — H9190 Unspecified hearing loss, unspecified ear: Secondary | ICD-10-CM | POA: Diagnosis not present

## 2019-12-20 DIAGNOSIS — Z7982 Long term (current) use of aspirin: Secondary | ICD-10-CM | POA: Diagnosis not present

## 2019-12-20 DIAGNOSIS — G309 Alzheimer's disease, unspecified: Secondary | ICD-10-CM | POA: Diagnosis not present

## 2019-12-20 DIAGNOSIS — Z86010 Personal history of colonic polyps: Secondary | ICD-10-CM | POA: Diagnosis not present

## 2019-12-22 ENCOUNTER — Encounter: Payer: Self-pay | Admitting: Emergency Medicine

## 2019-12-22 ENCOUNTER — Other Ambulatory Visit: Payer: Self-pay

## 2019-12-22 ENCOUNTER — Emergency Department: Payer: PPO

## 2019-12-22 ENCOUNTER — Emergency Department
Admission: EM | Admit: 2019-12-22 | Discharge: 2019-12-22 | Disposition: A | Discharge status: Home / Self Care | Payer: PPO | Attending: Emergency Medicine | Admitting: Emergency Medicine

## 2019-12-22 DIAGNOSIS — R079 Chest pain, unspecified: Secondary | ICD-10-CM

## 2019-12-22 DIAGNOSIS — K449 Diaphragmatic hernia without obstruction or gangrene: Secondary | ICD-10-CM | POA: Insufficient documentation

## 2019-12-22 DIAGNOSIS — I10 Essential (primary) hypertension: Secondary | ICD-10-CM | POA: Insufficient documentation

## 2019-12-22 DIAGNOSIS — Z79899 Other long term (current) drug therapy: Secondary | ICD-10-CM | POA: Diagnosis not present

## 2019-12-22 DIAGNOSIS — R0789 Other chest pain: Secondary | ICD-10-CM | POA: Diagnosis not present

## 2019-12-22 DIAGNOSIS — F17220 Nicotine dependence, chewing tobacco, uncomplicated: Secondary | ICD-10-CM | POA: Diagnosis not present

## 2019-12-22 LAB — BASIC METABOLIC PANEL
Anion gap: 9 (ref 5–15)
BUN: 12 mg/dL (ref 8–23)
CO2: 29 mmol/L (ref 22–32)
Calcium: 9.3 mg/dL (ref 8.9–10.3)
Chloride: 103 mmol/L (ref 98–111)
Creatinine, Ser: 0.94 mg/dL (ref 0.61–1.24)
GFR calc Af Amer: >60 mL/min (ref >60)
GFR calc non Af Amer: >60 mL/min (ref >60)
Glucose, Bld: 118 mg/dL — ABNORMAL HIGH (ref 70–99)
Potassium: 4 mmol/L (ref 3.5–5.1)
Sodium: 141 mmol/L (ref 135–145)

## 2019-12-22 LAB — CBC
HCT: 39.6 % (ref 39.0–52.0)
Hemoglobin: 13.3 g/dL (ref 13.0–17.0)
MCH: 29.9 pg (ref 26.0–34.0)
MCHC: 33.6 g/dL (ref 30.0–36.0)
MCV: 89 fL (ref 80.0–100.0)
Platelets: 250 10*3/uL (ref 150–400)
RBC: 4.45 MIL/uL (ref 4.22–5.81)
RDW: 12.7 % (ref 11.5–15.5)
WBC: 7.7 10*3/uL (ref 4.0–10.5)
nRBC: 0 % (ref 0.0–0.2)

## 2019-12-22 LAB — TROPONIN I (HIGH SENSITIVITY): Troponin I (High Sensitivity): 5 ng/L (ref <18)

## 2019-12-22 MED ORDER — LIDOCAINE VISCOUS HCL 2 % MT SOLN
15.0000 mL | Freq: Once | OROMUCOSAL | Status: AC
  Administered 2019-12-22: 15:00:00 15 mL via ORAL
  Filled 2019-12-22: qty 15

## 2019-12-22 MED ORDER — TRAMADOL HCL 50 MG PO TABS: 50.0000 mg | ORAL_TABLET | Freq: Four times a day (QID) | ORAL | 0 refills | Status: DC | PRN

## 2019-12-22 MED ORDER — TRAMADOL HCL 50 MG PO TABS
50.0000 mg | ORAL_TABLET | Freq: Once | ORAL | Status: AC
  Administered 2019-12-22: 50 mg via ORAL
  Filled 2019-12-22: qty 1

## 2019-12-22 MED ORDER — ALUM & MAG HYDROXIDE-SIMETH 200-200-20 MG/5ML PO SUSP
30.0000 mL | Freq: Once | ORAL | Status: AC
  Administered 2019-12-22: 15:00:00 30 mL via ORAL
  Filled 2019-12-22: qty 30

## 2019-12-22 NOTE — ED Provider Notes (Signed)
[facility] Emergency Department Provider Note  Time seen: 2:21 PM  I have reviewed the triage vital signs and the nursing notes.   HISTORY  Chief Complaint Chest Pain   HPI Stephen Alvarez is a 86 y.o. male with a past medical history of hypertension, hyperlipidemia, gastric reflux, presents to the emergency department for left-sided chest discomfort.  According to the patient for the past 2 to 3 weeks he has been experiencing some mild left-sided discomfort in his chest.  Denies any nausea or shortness of breath.  Denies any fever or significant cough.  Describes the pain as mild dull pain.   Past Medical History:  Diagnosis Date  . Chewing tobacco use   . Diverticulosis   . Hyperlipidemia   . Hypertension   . Indigestion     Patient Active Problem List   Diagnosis Date Noted  . Hyperlipemia 03/31/2015  . GERD (gastroesophageal reflux disease) 03/31/2015  . Chewing tobacco use     Past Surgical History:  Procedure Laterality Date  . FRACTURE SURGERY  2000   Hip, shoulder, knee  . VASECTOMY      Prior to Admission medications   Medication Sig Start Date End Date Taking? Authorizing Provider  hydrochlorothiazide (MICROZIDE) 12.5 MG capsule Take 1 capsule (12.5 mg total) by mouth daily. Patient taking differently: Take 12.5 mg by mouth at bedtime.  08/28/15   Donita Brooks, MD  meclizine (ANTIVERT) 25 MG tablet Take 1 tablet (25 mg total) by mouth 3 (three) times daily as needed for dizziness. 09/05/15   Stacy Gardner, MD  metoprolol succinate (TOPROL XL) 25 MG 24 hr tablet Take 1 tablet (25 mg total) by mouth daily. 08/12/16   Almond Lint, MD  Multiple Vitamin (MULTIVITAMIN WITH MINERALS) TABS tablet Take 1 tablet by mouth daily.    [provider]  omeprazole (PRILOSEC) 20 MG capsule TAKE ONE CAPSULE BY MOUTH ONCE DAILY 09/15/15   Donita Brooks, MD  simvastatin (ZOCOR) 40 MG tablet TAKE ONE TABLET BY MOUTH ONCE DAILY 09/29/15    Donita Brooks, MD    No Active Allergies  Family History  Problem Relation Age of Onset  . Stroke Father   . Depression Brother   . Diabetes Brother   . Heart disease Brother   . Hyperlipidemia Daughter   . Hypertension Daughter     Social History Social History   Tobacco Use  . Smoking status: Never Smoker  . Smokeless tobacco: Current User    Types: Chew  Substance Use Topics  . Alcohol use: No  . Drug use: No    Review of Systems Constitutional: Negative for fever. Cardiovascular: Mild left-sided chest discomfort. Respiratory: Negative for shortness of breath. Gastrointestinal: Negative for abdominal pain Genitourinary: Negative for urinary compaints Musculoskeletal: Negative for leg pain or swelling. Neurological: Negative for headache All other ROS negative  ____________________________________________   PHYSICAL EXAM:  VITAL SIGNS: ED Triage Vitals  Enc Vitals Group     BP 12/22/19 1132 (!) 164/66     Pulse Rate 12/22/19 1132 65     Resp 12/22/19 1132 18     Temp 12/22/19 1132 97.8 F (36.6 C)     Temp Source 12/22/19 1132 Oral     SpO2 12/22/19 1132 98 %     Weight 12/22/19 1137 170 lb (77.1 kg)     Height 12/22/19 1137 5\' 8"  (1.727 m)     Head Circumference --      Peak Flow --  Pain Score 12/22/19 1137 8     Pain Loc --      Pain Edu? --      Excl. in [place]? --    Constitutional: Alert and oriented. Well appearing and in no distress. Eyes: Normal exam ENT      Head: Normocephalic and atraumatic.      Mouth/Throat: Mucous membranes are moist. Cardiovascular: Normal rate, regular rhythm. No murmur Respiratory: Normal respiratory effort without tachypnea nor retractions. Breath sounds are clear.  Patient has moderate left anterior chest wall tenderness to palpation. Gastrointestinal: Soft and nontender. No distention.   Musculoskeletal: Nontender with normal range of motion in all extremities. No lower extremity tenderness or  edema. Neurologic:  Normal speech and language. No gross focal neurologic deficits  Skin:  Skin is warm, dry and intact.  Psychiatric: Mood and affect are normal.   ____________________________________________    EKG  EKG viewed and interpreted by myself shows a normal sinus rhythm at 64 bpm with a narrow QRS, normal axis, normal intervals, no concerning ST changes with occasional PVC.  ____________________________________________    RADIOLOGY  Chest x-ray shows hiatal hernia otherwise negative.  ____________________________________________   INITIAL IMPRESSION / ASSESSMENT AND PLAN / ED COURSE  Pertinent labs & imaging results that were available during my care of the patient were reviewed by me and considered in my medical decision making (see chart for details).   Patient presents to the emergency department for 2 to 3 weeks of left-sided chest discomfort.  Overall the patient appears extremely well, patient's lab work is reassuring including a negative troponin.  Chest x-ray does show a large hiatal hernia which could very possibly be the cause of the patient's discomfort.  Patient also has moderate left chest wall tenderness to palpation which could indicate musculoskeletal pain to be the cause of his discomfort.  However given 2 to 3 weeks of discomfort with a negative troponin I believe the patient is safe for discharge home.  We will place the patient on a trial of Ultram and Maalox and have the patient follow-up with his doctor as well as cardiology for further evaluation.  I discussed return precautions.  Patient agreeable to plan of care.  Stephen Alvarez was evaluated in Emergency Department on 12/22/2019 for the symptoms described in the history of present illness. He was evaluated in the context of the global COVID-19 pandemic, which necessitated consideration that the patient might be at risk for infection with the SARS-CoV-2 virus that causes COVID-19. Institutional  protocols and algorithms that pertain to the evaluation of patients at risk for COVID-19 are in a state of rapid change based on information released by regulatory bodies including the CDC and federal and state organizations. These policies and algorithms were followed during the patient's care in the ED.  ____________________________________________   FINAL CLINICAL IMPRESSION(S) / ED DIAGNOSES  Chest pain   Harvest Dark, MD 12/22/19 1424

## 2019-12-22 NOTE — ED Triage Notes (Signed)
Pt arrived via POV with c/o Left breast pain x 2-3 weeks, worse last night.  Denies any shortness of breath, weakness, dizziness or nausea.   Pt states the pain is non-radiating at this time.

## 2019-12-22 NOTE — Discharge Instructions (Addendum)
As we discussed please take your pain medication as needed only as written.  Please also use over-the-counter Maalox every 6 hours as needed for discomfort.  Please follow-up with your doctor in the next 2 to 3 days for recheck.  Please call the number provided for cardiology to arrange a follow-up appointment for further evaluation.  Return to the emergency department for any worsening pain development of shortness of breath, or any other symptom personally concerning to yourself.

## 2019-12-22 NOTE — ED Notes (Signed)
First Nurse Note: Pt to ED via POV c/o chest pain. Pt placed in wheelchair by EDT and taken for EKG. Pt is in NAD.

## 2019-12-25 DIAGNOSIS — J90 Pleural effusion, not elsewhere classified: Secondary | ICD-10-CM | POA: Diagnosis not present

## 2019-12-25 DIAGNOSIS — R0789 Other chest pain: Secondary | ICD-10-CM | POA: Diagnosis not present

## 2019-12-27 DIAGNOSIS — H9190 Unspecified hearing loss, unspecified ear: Secondary | ICD-10-CM | POA: Diagnosis not present

## 2019-12-27 DIAGNOSIS — G309 Alzheimer's disease, unspecified: Secondary | ICD-10-CM | POA: Diagnosis not present

## 2019-12-27 DIAGNOSIS — G20 Parkinson's disease: Secondary | ICD-10-CM | POA: Diagnosis not present

## 2019-12-27 DIAGNOSIS — Z9181 History of falling: Secondary | ICD-10-CM | POA: Diagnosis not present

## 2019-12-27 DIAGNOSIS — I10 Essential (primary) hypertension: Secondary | ICD-10-CM | POA: Diagnosis not present

## 2019-12-27 DIAGNOSIS — N400 Enlarged prostate without lower urinary tract symptoms: Secondary | ICD-10-CM | POA: Diagnosis not present

## 2019-12-27 DIAGNOSIS — Z86010 Personal history of colonic polyps: Secondary | ICD-10-CM | POA: Diagnosis not present

## 2019-12-27 DIAGNOSIS — M1712 Unilateral primary osteoarthritis, left knee: Secondary | ICD-10-CM | POA: Diagnosis not present

## 2019-12-27 DIAGNOSIS — G4700 Insomnia, unspecified: Secondary | ICD-10-CM | POA: Diagnosis not present

## 2019-12-27 DIAGNOSIS — K219 Gastro-esophageal reflux disease without esophagitis: Secondary | ICD-10-CM | POA: Diagnosis not present

## 2019-12-27 DIAGNOSIS — H3530 Unspecified macular degeneration: Secondary | ICD-10-CM | POA: Diagnosis not present

## 2019-12-27 DIAGNOSIS — F0280 Dementia in other diseases classified elsewhere without behavioral disturbance: Secondary | ICD-10-CM | POA: Diagnosis not present

## 2019-12-27 DIAGNOSIS — Z7982 Long term (current) use of aspirin: Secondary | ICD-10-CM | POA: Diagnosis not present

## 2019-12-27 DIAGNOSIS — H25012 Cortical age-related cataract, left eye: Secondary | ICD-10-CM | POA: Diagnosis not present

## 2019-12-27 DIAGNOSIS — K5790 Diverticulosis of intestine, part unspecified, without perforation or abscess without bleeding: Secondary | ICD-10-CM | POA: Diagnosis not present

## 2019-12-27 DIAGNOSIS — F0150 Vascular dementia without behavioral disturbance: Secondary | ICD-10-CM | POA: Diagnosis not present

## 2020-01-03 DIAGNOSIS — F0280 Dementia in other diseases classified elsewhere without behavioral disturbance: Secondary | ICD-10-CM | POA: Diagnosis not present

## 2020-01-03 DIAGNOSIS — F0150 Vascular dementia without behavioral disturbance: Secondary | ICD-10-CM | POA: Diagnosis not present

## 2020-01-03 DIAGNOSIS — K5790 Diverticulosis of intestine, part unspecified, without perforation or abscess without bleeding: Secondary | ICD-10-CM | POA: Diagnosis not present

## 2020-01-03 DIAGNOSIS — G20 Parkinson's disease: Secondary | ICD-10-CM | POA: Diagnosis not present

## 2020-01-03 DIAGNOSIS — I10 Essential (primary) hypertension: Secondary | ICD-10-CM | POA: Diagnosis not present

## 2020-01-03 DIAGNOSIS — G309 Alzheimer's disease, unspecified: Secondary | ICD-10-CM | POA: Diagnosis not present

## 2020-01-03 DIAGNOSIS — Z86010 Personal history of colonic polyps: Secondary | ICD-10-CM | POA: Diagnosis not present

## 2020-01-03 DIAGNOSIS — K219 Gastro-esophageal reflux disease without esophagitis: Secondary | ICD-10-CM | POA: Diagnosis not present

## 2020-01-03 DIAGNOSIS — Z9181 History of falling: Secondary | ICD-10-CM | POA: Diagnosis not present

## 2020-01-03 DIAGNOSIS — N400 Enlarged prostate without lower urinary tract symptoms: Secondary | ICD-10-CM | POA: Diagnosis not present

## 2020-01-03 DIAGNOSIS — M1712 Unilateral primary osteoarthritis, left knee: Secondary | ICD-10-CM | POA: Diagnosis not present

## 2020-01-03 DIAGNOSIS — H3530 Unspecified macular degeneration: Secondary | ICD-10-CM | POA: Diagnosis not present

## 2020-01-03 DIAGNOSIS — G4700 Insomnia, unspecified: Secondary | ICD-10-CM | POA: Diagnosis not present

## 2020-01-03 DIAGNOSIS — H25012 Cortical age-related cataract, left eye: Secondary | ICD-10-CM | POA: Diagnosis not present

## 2020-01-03 DIAGNOSIS — Z7982 Long term (current) use of aspirin: Secondary | ICD-10-CM | POA: Diagnosis not present

## 2020-01-03 DIAGNOSIS — H9190 Unspecified hearing loss, unspecified ear: Secondary | ICD-10-CM | POA: Diagnosis not present

## 2020-01-04 DIAGNOSIS — L600 Ingrowing nail: Secondary | ICD-10-CM | POA: Diagnosis not present

## 2020-01-04 DIAGNOSIS — B351 Tinea unguium: Secondary | ICD-10-CM | POA: Diagnosis not present

## 2020-01-04 DIAGNOSIS — M79674 Pain in right toe(s): Secondary | ICD-10-CM | POA: Diagnosis not present

## 2020-01-04 DIAGNOSIS — M79675 Pain in left toe(s): Secondary | ICD-10-CM | POA: Diagnosis not present

## 2020-01-08 DIAGNOSIS — Z7982 Long term (current) use of aspirin: Secondary | ICD-10-CM | POA: Diagnosis not present

## 2020-01-08 DIAGNOSIS — G309 Alzheimer's disease, unspecified: Secondary | ICD-10-CM | POA: Diagnosis not present

## 2020-01-08 DIAGNOSIS — K5790 Diverticulosis of intestine, part unspecified, without perforation or abscess without bleeding: Secondary | ICD-10-CM | POA: Diagnosis not present

## 2020-01-08 DIAGNOSIS — H9190 Unspecified hearing loss, unspecified ear: Secondary | ICD-10-CM | POA: Diagnosis not present

## 2020-01-08 DIAGNOSIS — K219 Gastro-esophageal reflux disease without esophagitis: Secondary | ICD-10-CM | POA: Diagnosis not present

## 2020-01-08 DIAGNOSIS — N400 Enlarged prostate without lower urinary tract symptoms: Secondary | ICD-10-CM | POA: Diagnosis not present

## 2020-01-08 DIAGNOSIS — Z86010 Personal history of colonic polyps: Secondary | ICD-10-CM | POA: Diagnosis not present

## 2020-01-08 DIAGNOSIS — H3530 Unspecified macular degeneration: Secondary | ICD-10-CM | POA: Diagnosis not present

## 2020-01-08 DIAGNOSIS — F0280 Dementia in other diseases classified elsewhere without behavioral disturbance: Secondary | ICD-10-CM | POA: Diagnosis not present

## 2020-01-08 DIAGNOSIS — G20 Parkinson's disease: Secondary | ICD-10-CM | POA: Diagnosis not present

## 2020-01-08 DIAGNOSIS — G4700 Insomnia, unspecified: Secondary | ICD-10-CM | POA: Diagnosis not present

## 2020-01-08 DIAGNOSIS — I10 Essential (primary) hypertension: Secondary | ICD-10-CM | POA: Diagnosis not present

## 2020-01-08 DIAGNOSIS — F0150 Vascular dementia without behavioral disturbance: Secondary | ICD-10-CM | POA: Diagnosis not present

## 2020-01-08 DIAGNOSIS — M1712 Unilateral primary osteoarthritis, left knee: Secondary | ICD-10-CM | POA: Diagnosis not present

## 2020-01-08 DIAGNOSIS — H25012 Cortical age-related cataract, left eye: Secondary | ICD-10-CM | POA: Diagnosis not present

## 2020-01-08 DIAGNOSIS — Z9181 History of falling: Secondary | ICD-10-CM | POA: Diagnosis not present

## 2020-01-16 DIAGNOSIS — Z79899 Other long term (current) drug therapy: Secondary | ICD-10-CM | POA: Diagnosis not present

## 2020-01-16 DIAGNOSIS — R7309 Other abnormal glucose: Secondary | ICD-10-CM | POA: Diagnosis not present

## 2020-01-16 DIAGNOSIS — I10 Essential (primary) hypertension: Secondary | ICD-10-CM | POA: Diagnosis not present

## 2020-01-16 DIAGNOSIS — Z125 Encounter for screening for malignant neoplasm of prostate: Secondary | ICD-10-CM | POA: Diagnosis not present

## 2020-01-16 DIAGNOSIS — H353211 Exudative age-related macular degeneration, right eye, with active choroidal neovascularization: Secondary | ICD-10-CM | POA: Diagnosis not present

## 2020-01-16 DIAGNOSIS — E7800 Pure hypercholesterolemia, unspecified: Secondary | ICD-10-CM | POA: Diagnosis not present

## 2020-01-18 DIAGNOSIS — I10 Essential (primary) hypertension: Secondary | ICD-10-CM | POA: Diagnosis not present

## 2020-01-18 DIAGNOSIS — H25012 Cortical age-related cataract, left eye: Secondary | ICD-10-CM | POA: Diagnosis not present

## 2020-01-18 DIAGNOSIS — F0150 Vascular dementia without behavioral disturbance: Secondary | ICD-10-CM | POA: Diagnosis not present

## 2020-01-18 DIAGNOSIS — N400 Enlarged prostate without lower urinary tract symptoms: Secondary | ICD-10-CM | POA: Diagnosis not present

## 2020-01-18 DIAGNOSIS — K5790 Diverticulosis of intestine, part unspecified, without perforation or abscess without bleeding: Secondary | ICD-10-CM | POA: Diagnosis not present

## 2020-01-18 DIAGNOSIS — F0280 Dementia in other diseases classified elsewhere without behavioral disturbance: Secondary | ICD-10-CM | POA: Diagnosis not present

## 2020-01-18 DIAGNOSIS — G309 Alzheimer's disease, unspecified: Secondary | ICD-10-CM | POA: Diagnosis not present

## 2020-01-18 DIAGNOSIS — H9190 Unspecified hearing loss, unspecified ear: Secondary | ICD-10-CM | POA: Diagnosis not present

## 2020-01-18 DIAGNOSIS — M1712 Unilateral primary osteoarthritis, left knee: Secondary | ICD-10-CM | POA: Diagnosis not present

## 2020-01-18 DIAGNOSIS — Z86010 Personal history of colonic polyps: Secondary | ICD-10-CM | POA: Diagnosis not present

## 2020-01-18 DIAGNOSIS — Z7982 Long term (current) use of aspirin: Secondary | ICD-10-CM | POA: Diagnosis not present

## 2020-01-18 DIAGNOSIS — H3530 Unspecified macular degeneration: Secondary | ICD-10-CM | POA: Diagnosis not present

## 2020-01-18 DIAGNOSIS — G4700 Insomnia, unspecified: Secondary | ICD-10-CM | POA: Diagnosis not present

## 2020-01-18 DIAGNOSIS — G20 Parkinson's disease: Secondary | ICD-10-CM | POA: Diagnosis not present

## 2020-01-18 DIAGNOSIS — K219 Gastro-esophageal reflux disease without esophagitis: Secondary | ICD-10-CM | POA: Diagnosis not present

## 2020-01-18 DIAGNOSIS — Z9181 History of falling: Secondary | ICD-10-CM | POA: Diagnosis not present

## 2020-01-24 DIAGNOSIS — F0281 Dementia in other diseases classified elsewhere with behavioral disturbance: Secondary | ICD-10-CM | POA: Diagnosis not present

## 2020-01-24 DIAGNOSIS — Z0000 Encounter for general adult medical examination without abnormal findings: Secondary | ICD-10-CM | POA: Diagnosis not present

## 2020-01-24 DIAGNOSIS — E7800 Pure hypercholesterolemia, unspecified: Secondary | ICD-10-CM | POA: Diagnosis not present

## 2020-01-24 DIAGNOSIS — G309 Alzheimer's disease, unspecified: Secondary | ICD-10-CM | POA: Diagnosis not present

## 2020-01-24 DIAGNOSIS — G20 Parkinson's disease: Secondary | ICD-10-CM | POA: Diagnosis not present

## 2020-01-24 DIAGNOSIS — F0280 Dementia in other diseases classified elsewhere without behavioral disturbance: Secondary | ICD-10-CM | POA: Diagnosis not present

## 2020-01-24 DIAGNOSIS — F0150 Vascular dementia without behavioral disturbance: Secondary | ICD-10-CM | POA: Diagnosis not present

## 2020-01-24 DIAGNOSIS — I10 Essential (primary) hypertension: Secondary | ICD-10-CM | POA: Diagnosis not present

## 2020-03-05 DIAGNOSIS — H353211 Exudative age-related macular degeneration, right eye, with active choroidal neovascularization: Secondary | ICD-10-CM | POA: Diagnosis not present

## 2020-04-30 DIAGNOSIS — H353211 Exudative age-related macular degeneration, right eye, with active choroidal neovascularization: Secondary | ICD-10-CM | POA: Diagnosis not present

## 2020-05-02 DIAGNOSIS — E7800 Pure hypercholesterolemia, unspecified: Secondary | ICD-10-CM | POA: Diagnosis not present

## 2020-05-02 DIAGNOSIS — G309 Alzheimer's disease, unspecified: Secondary | ICD-10-CM | POA: Diagnosis not present

## 2020-05-02 DIAGNOSIS — F0150 Vascular dementia without behavioral disturbance: Secondary | ICD-10-CM | POA: Diagnosis not present

## 2020-05-02 DIAGNOSIS — G20 Parkinson's disease: Secondary | ICD-10-CM | POA: Diagnosis not present

## 2020-05-02 DIAGNOSIS — R7309 Other abnormal glucose: Secondary | ICD-10-CM | POA: Diagnosis not present

## 2020-05-02 DIAGNOSIS — Z79899 Other long term (current) drug therapy: Secondary | ICD-10-CM | POA: Diagnosis not present

## 2020-05-02 DIAGNOSIS — I10 Essential (primary) hypertension: Secondary | ICD-10-CM | POA: Diagnosis not present

## 2020-05-02 DIAGNOSIS — F0280 Dementia in other diseases classified elsewhere without behavioral disturbance: Secondary | ICD-10-CM | POA: Diagnosis not present

## 2020-05-14 DIAGNOSIS — H401131 Primary open-angle glaucoma, bilateral, mild stage: Secondary | ICD-10-CM | POA: Diagnosis not present

## 2020-05-17 ENCOUNTER — Other Ambulatory Visit: Payer: Self-pay

## 2020-05-17 ENCOUNTER — Emergency Department
Admission: EM | Admit: 2020-05-17 | Discharge: 2020-05-17 | Disposition: A | Discharge status: Home / Self Care | Payer: PPO | Attending: Student in an Organized Health Care Education/Training Program | Admitting: Student in an Organized Health Care Education/Training Program

## 2020-05-17 ENCOUNTER — Encounter: Payer: Self-pay | Admitting: Emergency Medicine

## 2020-05-17 ENCOUNTER — Emergency Department: Payer: PPO

## 2020-05-17 DIAGNOSIS — K5900 Constipation, unspecified: Secondary | ICD-10-CM | POA: Diagnosis not present

## 2020-05-17 DIAGNOSIS — Z79899 Other long term (current) drug therapy: Secondary | ICD-10-CM | POA: Diagnosis not present

## 2020-05-17 DIAGNOSIS — F17220 Nicotine dependence, chewing tobacco, uncomplicated: Secondary | ICD-10-CM | POA: Insufficient documentation

## 2020-05-17 DIAGNOSIS — K449 Diaphragmatic hernia without obstruction or gangrene: Secondary | ICD-10-CM | POA: Diagnosis not present

## 2020-05-17 DIAGNOSIS — K219 Gastro-esophageal reflux disease without esophagitis: Secondary | ICD-10-CM | POA: Insufficient documentation

## 2020-05-17 DIAGNOSIS — R110 Nausea: Secondary | ICD-10-CM | POA: Diagnosis not present

## 2020-05-17 DIAGNOSIS — I10 Essential (primary) hypertension: Secondary | ICD-10-CM | POA: Insufficient documentation

## 2020-05-17 DIAGNOSIS — R001 Bradycardia, unspecified: Secondary | ICD-10-CM | POA: Diagnosis not present

## 2020-05-17 LAB — COMPREHENSIVE METABOLIC PANEL
ALT: 18 U/L (ref 0–44)
AST: 26 U/L (ref 15–41)
Albumin: 3.7 g/dL (ref 3.5–5.0)
Alkaline Phosphatase: 50 U/L (ref 38–126)
Anion gap: 9 (ref 5–15)
BUN: 15 mg/dL (ref 8–23)
CO2: 27 mmol/L (ref 22–32)
Calcium: 9.3 mg/dL (ref 8.9–10.3)
Chloride: 103 mmol/L (ref 98–111)
Creatinine, Ser: 0.89 mg/dL (ref 0.61–1.24)
GFR calc Af Amer: >60 mL/min (ref >60)
GFR calc non Af Amer: >60 mL/min (ref >60)
Glucose, Bld: 136 mg/dL — ABNORMAL HIGH (ref 70–99)
Potassium: 3.8 mmol/L (ref 3.5–5.1)
Sodium: 139 mmol/L (ref 135–145)
Total Bilirubin: 0.7 mg/dL (ref 0.3–1.2)
Total Protein: 7.4 g/dL (ref 6.5–8.1)

## 2020-05-17 LAB — CBC
HCT: 40.7 % (ref 39.0–52.0)
Hemoglobin: 13.5 g/dL (ref 13.0–17.0)
MCH: 29.7 pg (ref 26.0–34.0)
MCHC: 33.2 g/dL (ref 30.0–36.0)
MCV: 89.5 fL (ref 80.0–100.0)
Platelets: 236 10*3/uL (ref 150–400)
RBC: 4.55 MIL/uL (ref 4.22–5.81)
RDW: 12.6 % (ref 11.5–15.5)
WBC: 8.3 10*3/uL (ref 4.0–10.5)
nRBC: 0 % (ref 0.0–0.2)

## 2020-05-17 LAB — LIPASE, BLOOD: Lipase: 32 U/L (ref 11–51)

## 2020-05-17 LAB — TROPONIN I (HIGH SENSITIVITY): Troponin I (High Sensitivity): 7 ng/L (ref <18)

## 2020-05-17 MED ORDER — LACTULOSE 10 GM/15ML PO SOLN: 30.0000 g | Freq: Once | ORAL | Status: DC

## 2020-05-17 MED ORDER — DULCOLAX 5 MG PO TBEC: 5.0000 mg | DELAYED_RELEASE_TABLET | Freq: Every day | ORAL | 1 refills | Status: AC | PRN

## 2020-05-17 MED ORDER — POLYETHYLENE GLYCOL 3350 17 G PO PACK: 17.0000 g | PACK | Freq: Every day | ORAL | 0 refills | Status: DC

## 2020-05-17 MED ORDER — LACTULOSE 10 GM/15ML PO SOLN
20.0000 g | Freq: Once | ORAL | Status: AC
  Administered 2020-05-17: 20 g via ORAL
  Filled 2020-05-17: qty 30

## 2020-05-17 NOTE — ED Provider Notes (Signed)
[facility] Emergency Department Provider Note    First MD Initiated Contact with Patient 05/17/20 2100     (approximate)  I have reviewed the triage vital signs and the nursing notes.   HISTORY  Chief Complaint Constipation    HPI Stephen Alvarez is a 86 y.o. male presents to the ER for evaluation of constipation.  States he has not been able to move his bowels for the past 3 days.  States that anytime he tries he feels like he has to strain and can only move a little bit very hard formed stool.  He denies any nausea or vomiting.  No chest pain or shortness of breath.  No fevers.  Was previously on stool softeners but has not been on any recently.  No new medications.    Past Medical History:  Diagnosis Date  . Chewing tobacco use   . Diverticulosis   . Hyperlipidemia   . Hypertension   . Indigestion    Family History  Problem Relation Age of Onset  . Stroke Father   . Depression Brother   . Diabetes Brother   . Heart disease Brother   . Hyperlipidemia Daughter   . Hypertension Daughter    Past Surgical History:  Procedure Laterality Date  . FRACTURE SURGERY  2000   Hip, shoulder, knee  . VASECTOMY     Patient Active Problem List   Diagnosis Date Noted  . Hyperlipemia 03/31/2015  . GERD (gastroesophageal reflux disease) 03/31/2015  . Chewing tobacco use       Prior to Admission medications   Medication Sig Start Date End Date Taking? Authorizing Provider  bisacodyl (DULCOLAX) 5 MG EC tablet Take 1 tablet (5 mg total) by mouth daily as needed for moderate constipation. 05/17/20 05/17/21  Willy Eddy, MD  hydrochlorothiazide (MICROZIDE) 12.5 MG capsule Take 1 capsule (12.5 mg total) by mouth daily. Patient taking differently: Take 12.5 mg by mouth at bedtime.  08/28/15   Donita Brooks, MD  meclizine (ANTIVERT) 25 MG tablet Take 1 tablet (25 mg total) by mouth 3 (three) times daily as needed for dizziness. 09/05/15   Stacy Gardner, MD  metoprolol succinate (TOPROL XL) 25 MG 24 hr tablet Take 1 tablet (25 mg total) by mouth daily. 08/12/16   Almond Lint, MD  Multiple Vitamin (MULTIVITAMIN WITH MINERALS) TABS tablet Take 1 tablet by mouth daily.    [provider]  omeprazole (PRILOSEC) 20 MG capsule TAKE ONE CAPSULE BY MOUTH ONCE DAILY 09/15/15   Donita Brooks, MD  polyethylene glycol (MIRALAX / GLYCOLAX) 17 g packet Take 17 g by mouth daily. Mix one tablespoon with 8oz of your favorite juice or water every day until you are having soft formed stools. Then start taking once daily if you didn't have a stool the day before. 05/17/20   Willy Eddy, MD  simvastatin (ZOCOR) 40 MG tablet TAKE ONE TABLET BY MOUTH ONCE DAILY 09/29/15   Donita Brooks, MD  traMADol (ULTRAM) 50 MG tablet Take 1 tablet (50 mg total) by mouth every 6 (six) hours as needed. 12/22/19   Minna Antis, MD    Allergies Patient has no known allergies.    Social History Social History   Tobacco Use  . Smoking status: Never Smoker  . Smokeless tobacco: Current User    Types: Chew  Vaping Use  . Vaping Use: Never used  Substance Use Topics  . Alcohol use: No  . Drug use: No  Review of Systems Patient denies headaches, rhinorrhea, blurry vision, numbness, shortness of breath, chest pain, edema, cough, abdominal pain, nausea, vomiting, diarrhea, dysuria, fevers, rashes or hallucinations unless otherwise stated above in HPI. ____________________________________________   PHYSICAL EXAM:  VITAL SIGNS: Vitals:   05/17/20 1510 05/17/20 2127  BP: (!) 141/66 (!) 169/68  Pulse: 63 69  Resp: 16 18  Temp: 97.8 F (36.6 C)   SpO2: 96% 100%    Constitutional: Alert and oriented.  Eyes: Conjunctivae are normal.  Head: Atraumatic. Nose: No congestion/rhinnorhea. Mouth/Throat: Mucous membranes are moist.   Neck: No stridor. Painless ROM.  Cardiovascular: Normal rate, regular rhythm. Grossly normal heart sounds.   Good peripheral circulation. Respiratory: Normal respiratory effort.  No retractions. Lungs CTAB. Gastrointestinal: Soft and nontender. No distention. No abdominal bruits. No CVA tenderness. Genitourinary: There hard formed brown nonmelanotic nonbloody stool in the rectal vault.  Large volume but no impaction.  Stool was able to be broken up and removed on exam.  Normal rectal tone Musculoskeletal: No lower extremity tenderness nor edema.  No joint effusions. Neurologic:  Normal speech and language. No gross focal neurologic deficits are appreciated. No facial droop Skin:  Skin is warm, dry and intact. No rash noted. Psychiatric: Mood and affect are normal. Speech and behavior are normal.  ____________________________________________   LABS (all labs ordered are listed, but only abnormal results are displayed)  Results for orders placed or performed during the hospital encounter of 05/17/20 (from the past 24 hour(s))  Lipase, blood     Status: None   Collection Time: 05/17/20  3:21 PM  Result Value Ref Range   Lipase 32 11 - 51 U/L  Comprehensive metabolic panel     Status: Abnormal   Collection Time: 05/17/20  3:21 PM  Result Value Ref Range   Sodium 139 135 - 145 mmol/L   Potassium 3.8 3.5 - 5.1 mmol/L   Chloride 103 98 - 111 mmol/L   CO2 27 22 - 32 mmol/L   Glucose, Bld 136 (H) 70 - 99 mg/dL   BUN 15 8 - 23 mg/dL   Creatinine, Ser 7.82 0.61 - 1.24 mg/dL   Calcium 9.3 8.9 - 42.3 mg/dL   Total Protein 7.4 6.5 - 8.1 g/dL   Albumin 3.7 3.5 - 5.0 g/dL   AST 26 15 - 41 U/L   ALT 18 0 - 44 U/L   Alkaline Phosphatase 50 38 - 126 U/L   Total Bilirubin 0.7 0.3 - 1.2 mg/dL   GFR calc non Af Amer >60 >60 mL/min   GFR calc Af Amer >60 >60 mL/min   Anion gap 9 5 - 15  CBC     Status: None   Collection Time: 05/17/20  3:21 PM  Result Value Ref Range   WBC 8.3 4.0 - 10.5 K/uL   RBC 4.55 4.22 - 5.81 MIL/uL   Hemoglobin 13.5 13.0 - 17.0 g/dL   HCT 53.6 39 - 52 %   MCV 89.5 80.0 - 100.0  fL   MCH 29.7 26.0 - 34.0 pg   MCHC 33.2 30.0 - 36.0 g/dL   RDW 14.4 31.5 - 40.0 %   Platelets 236 150 - 400 K/uL   nRBC 0.0 0.0 - 0.2 %  Troponin I (High Sensitivity)     Status: None   Collection Time: 05/17/20  3:21 PM  Result Value Ref Range   Troponin I (High Sensitivity) 7 <18 ng/L   ____________________________________________  EKG My review and personal interpretation at  Time: 15:11   Indication: constipation  Rate: 55  Rhythm: sinus with 1st degree AV block Axis: normal Other: normal intervals, ____________________________________________  RADIOLOGY  I personally reviewed all radiographic images ordered to evaluate for the above acute complaints and reviewed radiology reports and findings.  These findings were personally discussed with the patient.  Please see medical record for radiology report.  ____________________________________________   PROCEDURES  Procedure(s) performed:  Procedures    Critical Care performed: no ____________________________________________   INITIAL IMPRESSION / ASSESSMENT AND PLAN / ED COURSE  Pertinent labs & imaging results that were available during my care of the patient were reviewed by me and considered in my medical decision making (see chart for details).   DDX: Fecal impaction, constipation, SBO, electrolyte abnormality, anemia, medication effect  Stephen Alvarez is a 33 y.o. who presents to the ED with symptoms of constipation as described above.  Not consistent with small bowel obstruction.  Does have large stool volume but no significant impaction.  Given oral laxatives.  Blood work is otherwise reassuring.  Does appear stable appropriate for trial of outpatient management.     The patient was evaluated in Emergency Department today for the symptoms described in the history of present illness. He/she was evaluated in the context of the global COVID-19 pandemic, which necessitated consideration that the patient might be at  risk for infection with the SARS-CoV-2 virus that causes COVID-19. Institutional protocols and algorithms that pertain to the evaluation of patients at risk for COVID-19 are in a state of rapid change based on information released by regulatory bodies including the CDC and federal and state organizations. These policies and algorithms were followed during the patient's care in the ED.  As part of my medical decision making, I reviewed the following data within the electronic MEDICAL RECORD NUMBER Nursing notes reviewed and incorporated, Labs reviewed, notes from prior ED visits and [place] Controlled Substance Database   ____________________________________________   FINAL CLINICAL IMPRESSION(S) / ED DIAGNOSES  Final diagnoses:  Constipation, unspecified constipation type      NEW MEDICATIONS STARTED DURING THIS VISIT:  New Prescriptions   BISACODYL (DULCOLAX) 5 MG EC TABLET    Take 1 tablet (5 mg total) by mouth daily as needed for moderate constipation.   POLYETHYLENE GLYCOL (MIRALAX / GLYCOLAX) 17 G PACKET    Take 17 g by mouth daily. Mix one tablespoon with 8oz of your favorite juice or water every day until you are having soft formed stools. Then start taking once daily if you didn't have a stool the day before.     Note:  This document was prepared using Dragon voice recognition software and may include unintentional dictation errors.    Willy Eddy, MD 05/17/20 2221

## 2020-05-17 NOTE — ED Notes (Addendum)
Pt left without receiving DC paperwork. This RN left a HIPPA compliant voicemail to remind patient to pick up prescriptions and to call with any questions. Prescriptions and discharge information previously discussed by EDP with patient.  DC consent not signed by patient. Charge RN aware.

## 2020-05-17 NOTE — ED Triage Notes (Signed)
Reports no bowel movement for 3 days and having nausea.  Denies vomiting. No abdominal pain or cramping.  C/o feeling weak over past few days as well.  Unlabored, VSS. No fevers.

## 2020-05-22 DIAGNOSIS — R42 Dizziness and giddiness: Secondary | ICD-10-CM | POA: Diagnosis not present

## 2020-05-22 DIAGNOSIS — F0150 Vascular dementia without behavioral disturbance: Secondary | ICD-10-CM | POA: Diagnosis not present

## 2020-05-22 DIAGNOSIS — G309 Alzheimer's disease, unspecified: Secondary | ICD-10-CM | POA: Diagnosis not present

## 2020-05-22 DIAGNOSIS — F0280 Dementia in other diseases classified elsewhere without behavioral disturbance: Secondary | ICD-10-CM | POA: Diagnosis not present

## 2020-05-22 DIAGNOSIS — R269 Unspecified abnormalities of gait and mobility: Secondary | ICD-10-CM | POA: Diagnosis not present

## 2020-06-02 DIAGNOSIS — M79674 Pain in right toe(s): Secondary | ICD-10-CM | POA: Diagnosis not present

## 2020-06-02 DIAGNOSIS — M79675 Pain in left toe(s): Secondary | ICD-10-CM | POA: Diagnosis not present

## 2020-06-02 DIAGNOSIS — B351 Tinea unguium: Secondary | ICD-10-CM | POA: Diagnosis not present

## 2020-06-18 DIAGNOSIS — H353211 Exudative age-related macular degeneration, right eye, with active choroidal neovascularization: Secondary | ICD-10-CM | POA: Diagnosis not present

## 2020-07-30 DIAGNOSIS — R7309 Other abnormal glucose: Secondary | ICD-10-CM | POA: Diagnosis not present

## 2020-07-30 DIAGNOSIS — Z79899 Other long term (current) drug therapy: Secondary | ICD-10-CM | POA: Diagnosis not present

## 2020-07-30 DIAGNOSIS — I10 Essential (primary) hypertension: Secondary | ICD-10-CM | POA: Diagnosis not present

## 2020-07-30 DIAGNOSIS — E7800 Pure hypercholesterolemia, unspecified: Secondary | ICD-10-CM | POA: Diagnosis not present

## 2020-08-06 DIAGNOSIS — F0280 Dementia in other diseases classified elsewhere without behavioral disturbance: Secondary | ICD-10-CM | POA: Diagnosis not present

## 2020-08-06 DIAGNOSIS — G309 Alzheimer's disease, unspecified: Secondary | ICD-10-CM | POA: Diagnosis not present

## 2020-08-06 DIAGNOSIS — I10 Essential (primary) hypertension: Secondary | ICD-10-CM | POA: Diagnosis not present

## 2020-08-06 DIAGNOSIS — F0150 Vascular dementia without behavioral disturbance: Secondary | ICD-10-CM | POA: Diagnosis not present

## 2020-08-06 DIAGNOSIS — G20 Parkinson's disease: Secondary | ICD-10-CM | POA: Diagnosis not present

## 2020-08-06 DIAGNOSIS — F0281 Dementia in other diseases classified elsewhere with behavioral disturbance: Secondary | ICD-10-CM | POA: Diagnosis not present

## 2020-08-06 DIAGNOSIS — E7800 Pure hypercholesterolemia, unspecified: Secondary | ICD-10-CM | POA: Diagnosis not present

## 2020-08-07 DIAGNOSIS — H353211 Exudative age-related macular degeneration, right eye, with active choroidal neovascularization: Secondary | ICD-10-CM | POA: Diagnosis not present

## 2020-10-30 DIAGNOSIS — B351 Tinea unguium: Secondary | ICD-10-CM | POA: Diagnosis not present

## 2020-10-30 DIAGNOSIS — M79675 Pain in left toe(s): Secondary | ICD-10-CM | POA: Diagnosis not present

## 2020-10-30 DIAGNOSIS — M79674 Pain in right toe(s): Secondary | ICD-10-CM | POA: Diagnosis not present

## 2020-12-11 DIAGNOSIS — H401131 Primary open-angle glaucoma, bilateral, mild stage: Secondary | ICD-10-CM | POA: Diagnosis not present

## 2020-12-15 DIAGNOSIS — R739 Hyperglycemia, unspecified: Secondary | ICD-10-CM | POA: Diagnosis not present

## 2020-12-15 DIAGNOSIS — G309 Alzheimer's disease, unspecified: Secondary | ICD-10-CM | POA: Diagnosis not present

## 2020-12-15 DIAGNOSIS — F0390 Unspecified dementia without behavioral disturbance: Secondary | ICD-10-CM | POA: Diagnosis not present

## 2020-12-15 DIAGNOSIS — E785 Hyperlipidemia, unspecified: Secondary | ICD-10-CM | POA: Diagnosis not present

## 2020-12-15 DIAGNOSIS — I10 Essential (primary) hypertension: Secondary | ICD-10-CM | POA: Diagnosis not present

## 2020-12-15 DIAGNOSIS — Z0000 Encounter for general adult medical examination without abnormal findings: Secondary | ICD-10-CM | POA: Diagnosis not present

## 2020-12-17 DIAGNOSIS — H353222 Exudative age-related macular degeneration, left eye, with inactive choroidal neovascularization: Secondary | ICD-10-CM | POA: Diagnosis not present

## 2020-12-17 DIAGNOSIS — H401131 Primary open-angle glaucoma, bilateral, mild stage: Secondary | ICD-10-CM | POA: Diagnosis not present

## 2020-12-17 DIAGNOSIS — H353211 Exudative age-related macular degeneration, right eye, with active choroidal neovascularization: Secondary | ICD-10-CM | POA: Diagnosis not present

## 2021-01-28 DIAGNOSIS — M79674 Pain in right toe(s): Secondary | ICD-10-CM | POA: Diagnosis not present

## 2021-01-28 DIAGNOSIS — M79675 Pain in left toe(s): Secondary | ICD-10-CM | POA: Diagnosis not present

## 2021-01-28 DIAGNOSIS — B351 Tinea unguium: Secondary | ICD-10-CM | POA: Diagnosis not present

## 2021-02-11 DIAGNOSIS — H353211 Exudative age-related macular degeneration, right eye, with active choroidal neovascularization: Secondary | ICD-10-CM | POA: Diagnosis not present

## 2021-04-15 DIAGNOSIS — H353211 Exudative age-related macular degeneration, right eye, with active choroidal neovascularization: Secondary | ICD-10-CM | POA: Diagnosis not present

## 2021-05-08 DIAGNOSIS — M25552 Pain in left hip: Secondary | ICD-10-CM | POA: Diagnosis not present

## 2021-05-08 DIAGNOSIS — I10 Essential (primary) hypertension: Secondary | ICD-10-CM | POA: Diagnosis not present

## 2021-05-08 DIAGNOSIS — G20 Parkinson's disease: Secondary | ICD-10-CM | POA: Diagnosis not present

## 2021-05-21 DIAGNOSIS — G20 Parkinson's disease: Secondary | ICD-10-CM | POA: Diagnosis not present

## 2021-05-21 DIAGNOSIS — R269 Unspecified abnormalities of gait and mobility: Secondary | ICD-10-CM | POA: Diagnosis not present

## 2021-05-21 DIAGNOSIS — F0280 Dementia in other diseases classified elsewhere without behavioral disturbance: Secondary | ICD-10-CM | POA: Diagnosis not present

## 2021-05-21 DIAGNOSIS — F0150 Vascular dementia without behavioral disturbance: Secondary | ICD-10-CM | POA: Diagnosis not present

## 2021-05-21 DIAGNOSIS — G309 Alzheimer's disease, unspecified: Secondary | ICD-10-CM | POA: Diagnosis not present

## 2021-06-10 DIAGNOSIS — H353211 Exudative age-related macular degeneration, right eye, with active choroidal neovascularization: Secondary | ICD-10-CM | POA: Diagnosis not present

## 2021-06-10 DIAGNOSIS — H353222 Exudative age-related macular degeneration, left eye, with inactive choroidal neovascularization: Secondary | ICD-10-CM | POA: Diagnosis not present

## 2021-08-05 DIAGNOSIS — H353211 Exudative age-related macular degeneration, right eye, with active choroidal neovascularization: Secondary | ICD-10-CM | POA: Diagnosis not present

## 2021-08-05 DIAGNOSIS — H353222 Exudative age-related macular degeneration, left eye, with inactive choroidal neovascularization: Secondary | ICD-10-CM | POA: Diagnosis not present

## 2021-08-18 ENCOUNTER — Ambulatory Visit
Admission: RE | Admit: 2021-08-18 | Discharge: 2021-08-18 | Disposition: A | Discharge status: Home / Self Care | Payer: Medicare HMO | Source: Ambulatory Visit | Attending: Physician Assistant | Admitting: Physician Assistant

## 2021-08-18 ENCOUNTER — Other Ambulatory Visit: Payer: Self-pay | Admitting: Physician Assistant

## 2021-08-18 ENCOUNTER — Other Ambulatory Visit: Payer: Self-pay

## 2021-08-18 DIAGNOSIS — I82442 Acute embolism and thrombosis of left tibial vein: Secondary | ICD-10-CM | POA: Diagnosis not present

## 2021-08-18 DIAGNOSIS — I82432 Acute embolism and thrombosis of left popliteal vein: Secondary | ICD-10-CM | POA: Diagnosis not present

## 2021-08-18 DIAGNOSIS — M7989 Other specified soft tissue disorders: Secondary | ICD-10-CM

## 2021-08-18 DIAGNOSIS — I82412 Acute embolism and thrombosis of left femoral vein: Secondary | ICD-10-CM | POA: Diagnosis not present

## 2021-08-18 DIAGNOSIS — I824Z2 Acute embolism and thrombosis of unspecified deep veins of left distal lower extremity: Secondary | ICD-10-CM | POA: Diagnosis not present

## 2021-09-18 DIAGNOSIS — B351 Tinea unguium: Secondary | ICD-10-CM | POA: Diagnosis not present

## 2021-09-18 DIAGNOSIS — M79674 Pain in right toe(s): Secondary | ICD-10-CM | POA: Diagnosis not present

## 2021-09-18 DIAGNOSIS — M79675 Pain in left toe(s): Secondary | ICD-10-CM | POA: Diagnosis not present

## 2021-09-30 DIAGNOSIS — H353211 Exudative age-related macular degeneration, right eye, with active choroidal neovascularization: Secondary | ICD-10-CM | POA: Diagnosis not present

## 2021-10-01 DIAGNOSIS — Z0000 Encounter for general adult medical examination without abnormal findings: Secondary | ICD-10-CM | POA: Diagnosis not present

## 2021-10-01 DIAGNOSIS — G20 Parkinson's disease: Secondary | ICD-10-CM | POA: Diagnosis not present

## 2021-10-01 DIAGNOSIS — E785 Hyperlipidemia, unspecified: Secondary | ICD-10-CM | POA: Diagnosis not present

## 2021-10-01 DIAGNOSIS — R739 Hyperglycemia, unspecified: Secondary | ICD-10-CM | POA: Diagnosis not present

## 2021-10-01 DIAGNOSIS — Z79899 Other long term (current) drug therapy: Secondary | ICD-10-CM | POA: Diagnosis not present

## 2021-10-01 DIAGNOSIS — I10 Essential (primary) hypertension: Secondary | ICD-10-CM | POA: Diagnosis not present

## 2021-10-01 DIAGNOSIS — F0280 Dementia in other diseases classified elsewhere without behavioral disturbance: Secondary | ICD-10-CM | POA: Diagnosis not present

## 2021-11-25 DIAGNOSIS — H353211 Exudative age-related macular degeneration, right eye, with active choroidal neovascularization: Secondary | ICD-10-CM | POA: Diagnosis not present

## 2021-12-16 DIAGNOSIS — H401131 Primary open-angle glaucoma, bilateral, mild stage: Secondary | ICD-10-CM | POA: Diagnosis not present

## 2022-01-22 DIAGNOSIS — H353211 Exudative age-related macular degeneration, right eye, with active choroidal neovascularization: Secondary | ICD-10-CM | POA: Diagnosis not present

## 2022-01-25 DIAGNOSIS — W5503XD Scratched by cat, subsequent encounter: Secondary | ICD-10-CM | POA: Diagnosis not present

## 2022-01-25 DIAGNOSIS — S60572D Other superficial bite of hand of left hand, subsequent encounter: Secondary | ICD-10-CM | POA: Diagnosis not present

## 2022-01-27 DIAGNOSIS — M79675 Pain in left toe(s): Secondary | ICD-10-CM | POA: Diagnosis not present

## 2022-01-27 DIAGNOSIS — B351 Tinea unguium: Secondary | ICD-10-CM | POA: Diagnosis not present

## 2022-01-27 DIAGNOSIS — M79674 Pain in right toe(s): Secondary | ICD-10-CM | POA: Diagnosis not present

## 2022-03-26 DIAGNOSIS — H353211 Exudative age-related macular degeneration, right eye, with active choroidal neovascularization: Secondary | ICD-10-CM | POA: Diagnosis not present

## 2022-03-31 DIAGNOSIS — Z0000 Encounter for general adult medical examination without abnormal findings: Secondary | ICD-10-CM | POA: Diagnosis not present

## 2022-03-31 DIAGNOSIS — G309 Alzheimer's disease, unspecified: Secondary | ICD-10-CM | POA: Diagnosis not present

## 2022-03-31 DIAGNOSIS — I10 Essential (primary) hypertension: Secondary | ICD-10-CM | POA: Diagnosis not present

## 2022-03-31 DIAGNOSIS — Z79899 Other long term (current) drug therapy: Secondary | ICD-10-CM | POA: Diagnosis not present

## 2022-03-31 DIAGNOSIS — R739 Hyperglycemia, unspecified: Secondary | ICD-10-CM | POA: Diagnosis not present

## 2022-03-31 DIAGNOSIS — E782 Mixed hyperlipidemia: Secondary | ICD-10-CM | POA: Diagnosis not present

## 2022-03-31 DIAGNOSIS — F0150 Vascular dementia without behavioral disturbance: Secondary | ICD-10-CM | POA: Diagnosis not present

## 2022-03-31 DIAGNOSIS — F0280 Dementia in other diseases classified elsewhere without behavioral disturbance: Secondary | ICD-10-CM | POA: Diagnosis not present

## 2022-06-18 DIAGNOSIS — M79675 Pain in left toe(s): Secondary | ICD-10-CM | POA: Diagnosis not present

## 2022-06-18 DIAGNOSIS — M79674 Pain in right toe(s): Secondary | ICD-10-CM | POA: Diagnosis not present

## 2022-06-18 DIAGNOSIS — B351 Tinea unguium: Secondary | ICD-10-CM | POA: Diagnosis not present

## 2022-06-23 DIAGNOSIS — H353211 Exudative age-related macular degeneration, right eye, with active choroidal neovascularization: Secondary | ICD-10-CM | POA: Diagnosis not present

## 2022-08-27 DIAGNOSIS — E7800 Pure hypercholesterolemia, unspecified: Secondary | ICD-10-CM | POA: Diagnosis not present

## 2022-08-27 DIAGNOSIS — G309 Alzheimer's disease, unspecified: Secondary | ICD-10-CM | POA: Diagnosis not present

## 2022-08-27 DIAGNOSIS — R7303 Prediabetes: Secondary | ICD-10-CM | POA: Diagnosis not present

## 2022-08-27 DIAGNOSIS — L989 Disorder of the skin and subcutaneous tissue, unspecified: Secondary | ICD-10-CM | POA: Diagnosis not present

## 2022-08-27 DIAGNOSIS — F0150 Vascular dementia without behavioral disturbance: Secondary | ICD-10-CM | POA: Diagnosis not present

## 2022-08-27 DIAGNOSIS — Z79899 Other long term (current) drug therapy: Secondary | ICD-10-CM | POA: Diagnosis not present

## 2022-08-27 DIAGNOSIS — Z23 Encounter for immunization: Secondary | ICD-10-CM | POA: Diagnosis not present

## 2022-08-27 DIAGNOSIS — N644 Mastodynia: Secondary | ICD-10-CM | POA: Diagnosis not present

## 2022-08-27 DIAGNOSIS — I10 Essential (primary) hypertension: Secondary | ICD-10-CM | POA: Diagnosis not present

## 2022-09-07 ENCOUNTER — Other Ambulatory Visit: Payer: Self-pay | Admitting: Internal Medicine

## 2022-09-07 DIAGNOSIS — N644 Mastodynia: Secondary | ICD-10-CM

## 2022-09-22 ENCOUNTER — Ambulatory Visit
Admission: RE | Admit: 2022-09-22 | Discharge: 2022-09-22 | Disposition: A | Discharge status: Home / Self Care | Payer: Medicare HMO | Source: Ambulatory Visit | Attending: Internal Medicine | Admitting: Internal Medicine

## 2022-09-22 DIAGNOSIS — N644 Mastodynia: Secondary | ICD-10-CM

## 2022-09-22 DIAGNOSIS — N62 Hypertrophy of breast: Secondary | ICD-10-CM | POA: Diagnosis not present

## 2022-11-12 DIAGNOSIS — M79675 Pain in left toe(s): Secondary | ICD-10-CM | POA: Diagnosis not present

## 2022-11-12 DIAGNOSIS — B351 Tinea unguium: Secondary | ICD-10-CM | POA: Diagnosis not present

## 2022-11-12 DIAGNOSIS — M79674 Pain in right toe(s): Secondary | ICD-10-CM | POA: Diagnosis not present

## 2022-11-18 DIAGNOSIS — C44319 Basal cell carcinoma of skin of other parts of face: Secondary | ICD-10-CM | POA: Diagnosis not present

## 2022-11-18 DIAGNOSIS — Z1283 Encounter for screening for malignant neoplasm of skin: Secondary | ICD-10-CM | POA: Diagnosis not present

## 2022-11-18 DIAGNOSIS — D225 Melanocytic nevi of trunk: Secondary | ICD-10-CM | POA: Diagnosis not present

## 2022-11-29 DIAGNOSIS — G20A1 Parkinson's disease without dyskinesia, without mention of fluctuations: Secondary | ICD-10-CM | POA: Diagnosis not present

## 2022-11-29 DIAGNOSIS — F0280 Dementia in other diseases classified elsewhere without behavioral disturbance: Secondary | ICD-10-CM | POA: Diagnosis not present

## 2022-11-29 DIAGNOSIS — I10 Essential (primary) hypertension: Secondary | ICD-10-CM | POA: Diagnosis not present

## 2022-11-29 DIAGNOSIS — R7303 Prediabetes: Secondary | ICD-10-CM | POA: Diagnosis not present

## 2022-11-29 DIAGNOSIS — E785 Hyperlipidemia, unspecified: Secondary | ICD-10-CM | POA: Diagnosis not present

## 2022-11-29 DIAGNOSIS — Z0000 Encounter for general adult medical examination without abnormal findings: Secondary | ICD-10-CM | POA: Diagnosis not present

## 2023-01-03 DIAGNOSIS — D485 Neoplasm of uncertain behavior of skin: Secondary | ICD-10-CM | POA: Diagnosis not present

## 2023-01-03 DIAGNOSIS — C44319 Basal cell carcinoma of skin of other parts of face: Secondary | ICD-10-CM | POA: Diagnosis not present

## 2023-01-25 ENCOUNTER — Other Ambulatory Visit: Payer: Self-pay | Admitting: Physician Assistant

## 2023-01-25 ENCOUNTER — Ambulatory Visit
Admission: RE | Admit: 2023-01-25 | Discharge: 2023-01-25 | Disposition: A | Discharge status: Home / Self Care | Payer: Medicare HMO | Source: Ambulatory Visit | Attending: Physician Assistant | Admitting: Physician Assistant

## 2023-01-25 DIAGNOSIS — M19041 Primary osteoarthritis, right hand: Secondary | ICD-10-CM | POA: Diagnosis not present

## 2023-01-25 DIAGNOSIS — M79641 Pain in right hand: Secondary | ICD-10-CM | POA: Diagnosis not present

## 2023-01-25 DIAGNOSIS — S0003XA Contusion of scalp, initial encounter: Secondary | ICD-10-CM | POA: Diagnosis not present

## 2023-01-25 DIAGNOSIS — L03811 Cellulitis of head [any part, except face]: Secondary | ICD-10-CM | POA: Diagnosis not present

## 2023-01-25 DIAGNOSIS — S0990XA Unspecified injury of head, initial encounter: Secondary | ICD-10-CM | POA: Diagnosis not present

## 2023-01-25 DIAGNOSIS — W19XXXA Unspecified fall, initial encounter: Secondary | ICD-10-CM | POA: Diagnosis not present

## 2023-01-25 DIAGNOSIS — Y92009 Unspecified place in unspecified non-institutional (private) residence as the place of occurrence of the external cause: Secondary | ICD-10-CM | POA: Diagnosis not present

## 2023-01-25 DIAGNOSIS — R7303 Prediabetes: Secondary | ICD-10-CM | POA: Diagnosis not present

## 2023-01-25 DIAGNOSIS — Z043 Encounter for examination and observation following other accident: Secondary | ICD-10-CM | POA: Diagnosis not present

## 2023-01-25 DIAGNOSIS — W010XXD Fall on same level from slipping, tripping and stumbling without subsequent striking against object, subsequent encounter: Secondary | ICD-10-CM | POA: Diagnosis not present

## 2023-01-25 DIAGNOSIS — I10 Essential (primary) hypertension: Secondary | ICD-10-CM | POA: Diagnosis not present

## 2023-01-28 DIAGNOSIS — I63549 Cerebral infarction due to unspecified occlusion or stenosis of unspecified cerebellar artery: Secondary | ICD-10-CM | POA: Diagnosis not present

## 2023-01-28 DIAGNOSIS — Z8673 Personal history of transient ischemic attack (TIA), and cerebral infarction without residual deficits: Secondary | ICD-10-CM | POA: Diagnosis not present

## 2023-02-04 DIAGNOSIS — M7989 Other specified soft tissue disorders: Secondary | ICD-10-CM | POA: Diagnosis not present

## 2023-02-04 DIAGNOSIS — Y92009 Unspecified place in unspecified non-institutional (private) residence as the place of occurrence of the external cause: Secondary | ICD-10-CM | POA: Diagnosis not present

## 2023-02-04 DIAGNOSIS — E7800 Pure hypercholesterolemia, unspecified: Secondary | ICD-10-CM | POA: Diagnosis not present

## 2023-02-04 DIAGNOSIS — W01198D Fall on same level from slipping, tripping and stumbling with subsequent striking against other object, subsequent encounter: Secondary | ICD-10-CM | POA: Diagnosis not present

## 2023-02-04 DIAGNOSIS — S0093XD Contusion of unspecified part of head, subsequent encounter: Secondary | ICD-10-CM | POA: Diagnosis not present

## 2023-02-14 DIAGNOSIS — M7989 Other specified soft tissue disorders: Secondary | ICD-10-CM | POA: Diagnosis not present

## 2023-03-07 DIAGNOSIS — I6523 Occlusion and stenosis of bilateral carotid arteries: Secondary | ICD-10-CM | POA: Diagnosis not present

## 2023-03-07 DIAGNOSIS — Z8673 Personal history of transient ischemic attack (TIA), and cerebral infarction without residual deficits: Secondary | ICD-10-CM | POA: Diagnosis not present

## 2023-03-07 DIAGNOSIS — I63549 Cerebral infarction due to unspecified occlusion or stenosis of unspecified cerebellar artery: Secondary | ICD-10-CM | POA: Diagnosis not present

## 2023-03-07 DIAGNOSIS — I6350 Cerebral infarction due to unspecified occlusion or stenosis of unspecified cerebral artery: Secondary | ICD-10-CM | POA: Diagnosis not present

## 2023-03-28 DIAGNOSIS — L602 Onychogryphosis: Secondary | ICD-10-CM | POA: Diagnosis not present

## 2023-03-28 DIAGNOSIS — R42 Dizziness and giddiness: Secondary | ICD-10-CM | POA: Diagnosis not present

## 2023-04-06 DIAGNOSIS — M79674 Pain in right toe(s): Secondary | ICD-10-CM | POA: Diagnosis not present

## 2023-04-06 DIAGNOSIS — Z79899 Other long term (current) drug therapy: Secondary | ICD-10-CM | POA: Diagnosis not present

## 2023-04-06 DIAGNOSIS — Z0000 Encounter for general adult medical examination without abnormal findings: Secondary | ICD-10-CM | POA: Diagnosis not present

## 2023-04-06 DIAGNOSIS — F0280 Dementia in other diseases classified elsewhere without behavioral disturbance: Secondary | ICD-10-CM | POA: Diagnosis not present

## 2023-04-06 DIAGNOSIS — M79675 Pain in left toe(s): Secondary | ICD-10-CM | POA: Diagnosis not present

## 2023-04-06 DIAGNOSIS — G309 Alzheimer's disease, unspecified: Secondary | ICD-10-CM | POA: Diagnosis not present

## 2023-04-06 DIAGNOSIS — B351 Tinea unguium: Secondary | ICD-10-CM | POA: Diagnosis not present

## 2023-04-06 DIAGNOSIS — F0150 Vascular dementia without behavioral disturbance: Secondary | ICD-10-CM | POA: Diagnosis not present

## 2023-04-06 DIAGNOSIS — I10 Essential (primary) hypertension: Secondary | ICD-10-CM | POA: Diagnosis not present

## 2023-04-13 DIAGNOSIS — G5601 Carpal tunnel syndrome, right upper limb: Secondary | ICD-10-CM | POA: Diagnosis not present

## 2023-05-06 DIAGNOSIS — F0150 Vascular dementia without behavioral disturbance: Secondary | ICD-10-CM | POA: Diagnosis not present

## 2023-05-06 DIAGNOSIS — R310 Gross hematuria: Secondary | ICD-10-CM | POA: Diagnosis not present

## 2023-05-06 DIAGNOSIS — G309 Alzheimer's disease, unspecified: Secondary | ICD-10-CM | POA: Diagnosis not present

## 2023-05-06 DIAGNOSIS — F0280 Dementia in other diseases classified elsewhere without behavioral disturbance: Secondary | ICD-10-CM | POA: Diagnosis not present

## 2023-05-20 DIAGNOSIS — R310 Gross hematuria: Secondary | ICD-10-CM | POA: Diagnosis not present

## 2023-05-27 DIAGNOSIS — F0280 Dementia in other diseases classified elsewhere without behavioral disturbance: Secondary | ICD-10-CM | POA: Diagnosis not present

## 2023-05-27 DIAGNOSIS — G309 Alzheimer's disease, unspecified: Secondary | ICD-10-CM | POA: Diagnosis not present

## 2023-05-27 DIAGNOSIS — R310 Gross hematuria: Secondary | ICD-10-CM | POA: Diagnosis not present

## 2023-05-27 DIAGNOSIS — I10 Essential (primary) hypertension: Secondary | ICD-10-CM | POA: Diagnosis not present

## 2023-05-27 DIAGNOSIS — F0150 Vascular dementia without behavioral disturbance: Secondary | ICD-10-CM | POA: Diagnosis not present

## 2023-05-27 DIAGNOSIS — R7303 Prediabetes: Secondary | ICD-10-CM | POA: Diagnosis not present

## 2023-05-27 DIAGNOSIS — E782 Mixed hyperlipidemia: Secondary | ICD-10-CM | POA: Diagnosis not present

## 2023-06-16 ENCOUNTER — Ambulatory Visit: Payer: Medicare HMO | Admitting: Urology

## 2023-06-16 DIAGNOSIS — C44319 Basal cell carcinoma of skin of other parts of face: Secondary | ICD-10-CM | POA: Diagnosis not present

## 2023-06-16 DIAGNOSIS — L570 Actinic keratosis: Secondary | ICD-10-CM | POA: Diagnosis not present

## 2023-06-16 DIAGNOSIS — Z08 Encounter for follow-up examination after completed treatment for malignant neoplasm: Secondary | ICD-10-CM | POA: Diagnosis not present

## 2023-06-16 DIAGNOSIS — X32XXXD Exposure to sunlight, subsequent encounter: Secondary | ICD-10-CM | POA: Diagnosis not present

## 2023-06-16 DIAGNOSIS — Z85828 Personal history of other malignant neoplasm of skin: Secondary | ICD-10-CM | POA: Diagnosis not present

## 2023-07-06 ENCOUNTER — Ambulatory Visit: Payer: Medicare HMO | Admitting: Urology

## 2023-07-06 VITALS — BP 154/85 | HR 62 | Ht 68.0 in | Wt 175.2 lb

## 2023-07-06 DIAGNOSIS — R310 Gross hematuria: Secondary | ICD-10-CM | POA: Diagnosis not present

## 2023-07-06 LAB — URINALYSIS, COMPLETE
Bilirubin, UA: NEGATIVE
Glucose, UA: NEGATIVE
Ketones, UA: NEGATIVE
Leukocytes,UA: NEGATIVE
Nitrite, UA: NEGATIVE
Specific Gravity, UA: 1.015 (ref 1.005–1.030)
Urobilinogen, Ur: 0.2 mg/dL (ref 0.2–1.0)
pH, UA: 6 (ref 5.0–7.5)

## 2023-07-06 LAB — MICROSCOPIC EXAMINATION

## 2023-07-06 NOTE — Patient Instructions (Signed)
Blood in the urine can wean number of different things.  He can be from enlarged prostate, bladder cancer, kidney cancer, prostate cancer, kidney stones, a ruptured blood vessel, etc.  We had a lengthy discussion today about whether or not to intervene on this.  If we did want to try to identify the cause of the blood in the urine, we would recommend a CT scan and office cystoscopy, a procedure where a camera is placed through the penis into the bladder for evaluation.  At this age in the setting of dementia, sometimes patients choose not to proceed with a workup.  If the family decision would be to not to choose to intervene in any way with any of the above conditions, it may make sense to defer further workup.  Please have a family discussion including the healthcare power of attorney regarding how you would like to proceed with this.  We can certainly pursue workup if desired or defer.  The best way to reach assess via MyChart.  Vanna Scotland, MD +

## 2023-07-06 NOTE — Progress Notes (Signed)
Marcelle Overlie Plume,acting as a scribe for Vanna Scotland, MD.,have documented all relevant documentation on the behalf of Vanna Scotland, MD,as directed by  Vanna Scotland, MD while in the presence of Vanna Scotland, MD.  07/06/2023 4:15 PM   Stephen Alvarez 1932-01-22 [id number]  Referring provider: Marguarite Arbour, MD [address] [facility] [place],  [place] [address]  Chief Complaint  Patient presents with   Establish Care   Hematuria    HPI: 89 year-old male with a personal history of Alzheimer's dementia who presents today for further evaluation of gross hematuria.    He was seen by his PCP on 05/06/2023 when they noticed blood in his urine. At the time, his urinalysis did have a large amount of blood, but also some leukocytes, grossly positive. Urine culture however, was negative, with only 10,000 colonies of bacteria. He was treated empirically at the time with antibiotics. He was given Ceftriaxone in the office and Keflex 500 mg BID x7 days. He returned back to the office later that month with several episodes of hematuria. He was started on Flomax and referred to urology. His urinalysis at that point showed blood without infection. He has not had any cross-sectional imaging.  He does not have a recent PSA.  His urinalysis today has persistent microscopic hematuria, 11-30 RBC per high powered field with no evidence of infection.   He denies any burning sensation during urination or difficulty urinating. There is no history of smoking. He has not observed any noticeable blood in the urine recently. He has a good appetite and is not experiencing any pain.   He chews tobacco but does not smoke. The family is involved in the decision-making process regarding further diagnostic workup due to his age and dementia.  He is accompanied by his son-in-law today.    PMH: Past Medical History:  Diagnosis Date   Chewing tobacco use    Diverticulosis    Hyperlipidemia     Hypertension    Indigestion     Surgical History: Past Surgical History:  Procedure Laterality Date   FRACTURE SURGERY  2000   Hip, shoulder, knee   VASECTOMY      Home Medications:  Allergies as of 07/06/2023   No Known Allergies      Medication List        Accurate as of July 06, 2023  4:15 PM. If you have any questions, ask your nurse or doctor.          STOP taking these medications    meclizine 25 MG tablet Commonly known as: ANTIVERT Stopped by: Vanna Scotland   metoprolol succinate 25 MG 24 hr tablet Commonly known as: Toprol XL Stopped by: Vanna Scotland   omeprazole 20 MG capsule Commonly known as: PRILOSEC Stopped by: Vanna Scotland   polyethylene glycol 17 g packet Commonly known as: MIRALAX / GLYCOLAX Stopped by: Vanna Scotland   traMADol 50 MG tablet Commonly known as: Ultram Stopped by: Vanna Scotland       TAKE these medications    clopidogrel 75 MG tablet Commonly known as: PLAVIX Take 75 mg by mouth daily.   hydrochlorothiazide 12.5 MG capsule Commonly known as: MICROZIDE Take 1 capsule (12.5 mg total) by mouth daily. What changed: when to take this   memantine 10 MG tablet Commonly known as: NAMENDA Take 1 tablet by mouth 2 (two) times daily.   multivitamin with minerals Tabs tablet Take 1 tablet by mouth daily.   simvastatin  40 MG tablet Commonly known as: ZOCOR TAKE ONE TABLET BY MOUTH ONCE DAILY   tamsulosin 0.4 MG Caps capsule Commonly known as: FLOMAX Take 0.4 mg by mouth daily.        Family History: Family History  Problem Relation Age of Onset   Stroke Father    Hyperlipidemia Daughter    Hypertension Daughter    Depression Brother    Diabetes Brother    Heart disease Brother    Breast cancer Neg Hx     Social History:  reports that he has never smoked. His smokeless tobacco use includes chew. He reports that he does not drink alcohol and does not use drugs.   Physical Exam: BP (!)  154/85   Pulse 62   Ht 5\' 8"  (1.727 m)   Wt 175 lb 4 oz (79.5 kg)   BMI 26.65 kg/m   Constitutional:  Alert and oriented, No acute distress. HEENT: [place] AT, moist mucus membranes.  Trachea midline, no masses. Neurologic: Grossly intact, no focal deficits, moving all 4 extremities. Psychiatric: Normal mood and affect.  Assessment & Plan:    1. Gross hematuria - Goals of care discussion, discussed the risks and benefits of pursuing work-up - Differential diagnoses were discussed at length including kidney stones, kidney or bladder tumors, burst blood vessels, enlarged prostate, or prostate cancer. Infection has been ruled out. - Discussed potential diagnostic procedures: CT scan and cystoscopy. These are optional and contingent on family decision-making. - Emphasized the importance of considering age, dementia, and quality of life in decision-making. - Recommended a family discussion to determine the level of diagnostic intervention desired, with the understanding that some conditions may remain undiagnosed.   Return based on family decision regarding further evaluation.  I have reviewed the above documentation for accuracy and completeness, and I agree with the above.   Vanna Scotland, MD    [facility] [address], [address] [place], [place] [address] [phone]

## 2023-07-19 ENCOUNTER — Encounter: Payer: Self-pay | Admitting: Urology

## 2023-08-08 DIAGNOSIS — E782 Mixed hyperlipidemia: Secondary | ICD-10-CM | POA: Diagnosis not present

## 2023-08-08 DIAGNOSIS — Z79899 Other long term (current) drug therapy: Secondary | ICD-10-CM | POA: Diagnosis not present

## 2023-08-08 DIAGNOSIS — R7303 Prediabetes: Secondary | ICD-10-CM | POA: Diagnosis not present

## 2023-08-08 DIAGNOSIS — F0150 Vascular dementia without behavioral disturbance: Secondary | ICD-10-CM | POA: Diagnosis not present

## 2023-08-08 DIAGNOSIS — I10 Essential (primary) hypertension: Secondary | ICD-10-CM | POA: Diagnosis not present

## 2023-08-08 DIAGNOSIS — G309 Alzheimer's disease, unspecified: Secondary | ICD-10-CM | POA: Diagnosis not present

## 2023-08-08 DIAGNOSIS — F0280 Dementia in other diseases classified elsewhere without behavioral disturbance: Secondary | ICD-10-CM | POA: Diagnosis not present

## 2023-08-17 ENCOUNTER — Ambulatory Visit: Payer: Medicare HMO | Admitting: Dermatology

## 2023-11-16 DIAGNOSIS — Z79899 Other long term (current) drug therapy: Secondary | ICD-10-CM | POA: Diagnosis not present

## 2023-11-16 DIAGNOSIS — G20A1 Parkinson's disease without dyskinesia, without mention of fluctuations: Secondary | ICD-10-CM | POA: Diagnosis not present

## 2023-11-16 DIAGNOSIS — R42 Dizziness and giddiness: Secondary | ICD-10-CM | POA: Diagnosis not present

## 2023-12-28 ENCOUNTER — Ambulatory Visit: Admitting: Podiatry

## 2023-12-28 ENCOUNTER — Encounter: Payer: Self-pay | Admitting: Podiatry

## 2023-12-28 DIAGNOSIS — M79676 Pain in unspecified toe(s): Secondary | ICD-10-CM | POA: Diagnosis not present

## 2023-12-28 DIAGNOSIS — B351 Tinea unguium: Secondary | ICD-10-CM | POA: Diagnosis not present

## 2023-12-28 NOTE — Progress Notes (Signed)
  Subjective:  Patient ID: Stephen Alvarez, male    DOB: 1932/05/29,  MRN: [id number] HPI Chief Complaint  Patient presents with   Nail Problem    "Check his feet and trim his toenails." N - check and trim toenails L - 1-5 bilateral D - 4 to 5 mos. Ago O - gradually worse C - long, thick, discolored A - none T - [facility] trim, wife used to cut them    89 y.o. male presents with the above complaint.   ROS: Denies fever chills nausea vomiting muscle aches pains calf pain back pain chest pain shortness of breath.  Presents with his son-in-law today he has severe dementia.  Past Medical History:  Diagnosis Date   Chewing tobacco use    Diverticulosis    Hyperlipidemia    Hypertension    Indigestion    Past Surgical History:  Procedure Laterality Date   FRACTURE SURGERY  2000   Hip, shoulder, knee   VASECTOMY      Current Outpatient Medications:    clopidogrel (PLAVIX) 75 MG tablet, Take 75 mg by mouth daily., Disp: , Rfl:    hydrochlorothiazide  (MICROZIDE ) 12.5 MG capsule, Take 1 capsule (12.5 mg total) by mouth daily. (Patient taking differently: Take 12.5 mg by mouth at bedtime.), Disp: 90 capsule, Rfl: 3   Multiple Vitamin (MULTIVITAMIN WITH MINERALS) TABS tablet, Take 1 tablet by mouth daily., Disp: , Rfl:    simvastatin  (ZOCOR ) 40 MG tablet, TAKE ONE TABLET BY MOUTH ONCE DAILY, Disp: 90 tablet, Rfl: 0   tamsulosin (FLOMAX) 0.4 MG CAPS capsule, Take 0.4 mg by mouth daily., Disp: , Rfl:    memantine (NAMENDA) 10 MG tablet, Take 1 tablet by mouth 2 (two) times daily., Disp: , Rfl:   No Known Allergies Review of Systems Objective:  There were no vitals filed for this visit.  General: Well developed, nourished, in no acute distress, alert and oriented x3   Dermatological: Skin is warm, dry and supple bilateral. Nails x 10 are thick yellow dystrophic clinically mycotic severely neglected.  [name] states that it has been since last July that his nails were  cut.; remaining integument appears unremarkable at this time. There are no open sores, no preulcerative lesions, no rash or signs of infection present.  Vascular: Dorsalis Pedis artery and Posterior Tibial artery pedal pulses are 2/4 bilateral with immedate capillary fill time. Pedal hair growth present. No varicosities and no lower extremity edema present bilateral.   Neruologic: Grossly intact via light touch bilateral. Vibratory intact via tuning fork bilateral. Protective threshold with Semmes Wienstein monofilament intact to all pedal sites bilateral. Patellar and Achilles deep tendon reflexes 2+ bilateral. No Babinski or clonus noted bilateral.   Musculoskeletal: No gross boney pedal deformities bilateral. No pain, crepitus, or limitation noted with foot and ankle range of motion bilateral. Muscular strength 5/5 in all groups tested bilateral.  Gait: Unassisted, Nonantalgic.    Radiographs:  None taken  Assessment & Plan:   Assessment: Nail dystrophy with pain in limb secondary to onychomycosis.  Plan: Pain limb secondary onychomycosis.     [place] [address] [place], [place]

## 2023-12-29 NOTE — Addendum Note (Signed)
 Addended by: Sanda Crome on: 12/29/2023 08:40 AM   Modules accepted: Level of Service

## 2024-01-09 DIAGNOSIS — I10 Essential (primary) hypertension: Secondary | ICD-10-CM | POA: Diagnosis not present

## 2024-01-09 DIAGNOSIS — R7303 Prediabetes: Secondary | ICD-10-CM | POA: Diagnosis not present

## 2024-01-09 DIAGNOSIS — E785 Hyperlipidemia, unspecified: Secondary | ICD-10-CM | POA: Diagnosis not present

## 2024-01-09 DIAGNOSIS — Z0000 Encounter for general adult medical examination without abnormal findings: Secondary | ICD-10-CM | POA: Diagnosis not present

## 2024-01-09 DIAGNOSIS — F0390 Unspecified dementia without behavioral disturbance: Secondary | ICD-10-CM | POA: Diagnosis not present

## 2024-03-29 ENCOUNTER — Ambulatory Visit: Admitting: Podiatry

## 2024-03-29 DIAGNOSIS — M79676 Pain in unspecified toe(s): Secondary | ICD-10-CM | POA: Diagnosis not present

## 2024-03-29 DIAGNOSIS — B351 Tinea unguium: Secondary | ICD-10-CM

## 2024-03-29 NOTE — Progress Notes (Signed)
  Subjective:  Patient ID: Stephen Alvarez, male    DOB: Jan 17, 1932,  MRN: [id number]  89 y.o. male presents to clinic with  painful thick toenails that are difficult to trim. Pain interferes with ambulation. Aggravating factors include wearing enclosed shoe gear. Pain is relieved with periodic professional debridement. He is accompanied by his son in law on today's visit. Chief Complaint  Patient presents with   Nail Problem    Thick painful toenails, 3 month follow up    New problem(s): None   PCP is Sparks, Reyes BIRCH, MD.  No Known Allergies  Review of Systems: Negative except as noted in the HPI.   Objective:  Stephen Alvarez is a pleasant 89 y.o. male in NAD. AAO x 3.  Vascular Examination: Vascular status intact b/l with palpable pedal pulses. CFT immediate b/l. No edema. No pain with calf compression b/l. Skin temperature gradient WNL b/l. No edema noted b/l LE.  Neurological Examination: Sensation grossly intact b/l with 10 gram monofilament. Vibratory sensation intact b/l.   Dermatological Examination: Pedal skin with normal turgor, texture and tone b/l. Toenails 1-5 b/l thick, discolored, elongated with subungual debris and pain on dorsal palpation. No hyperkeratotic lesions noted b/l.   Musculoskeletal Examination: Muscle strength 5/5 to b/l LE. No pain, crepitus or joint limitation noted with ROM bilateral LE. No gross bony deformities bilaterally.  Radiographs: None   Assessment:   1. Pain due to onychomycosis of toenail    Plan:  -Patient's family member present. All questions/concerns addressed on today's visit. -Consent given for treatment as described below: -Patient to continue soft, supportive shoe gear daily. -Mycotic toenails 1-5 bilaterally were debrided in length and girth with sterile nail nippers and dremel without incident. -Patient/POA to call should there be question/concern in the interim.  Return in about 3 months (around  06/29/2024).  Stephen Alvarez Merlin, DPM      [place] LOCATION: [address] [place] [address]                   Office [phone]   [facility] LOCATION: [address] [place], [place] [address] Office [phone]

## 2024-04-01 ENCOUNTER — Encounter: Payer: Self-pay | Admitting: Podiatry

## 2024-04-12 DIAGNOSIS — I10 Essential (primary) hypertension: Secondary | ICD-10-CM | POA: Diagnosis not present

## 2024-04-12 DIAGNOSIS — E7800 Pure hypercholesterolemia, unspecified: Secondary | ICD-10-CM | POA: Diagnosis not present

## 2024-04-12 DIAGNOSIS — R7303 Prediabetes: Secondary | ICD-10-CM | POA: Diagnosis not present

## 2024-07-02 ENCOUNTER — Ambulatory Visit: Admitting: Podiatry

## 2024-07-02 ENCOUNTER — Encounter: Payer: Self-pay | Admitting: Podiatry

## 2024-07-02 DIAGNOSIS — M79676 Pain in unspecified toe(s): Secondary | ICD-10-CM

## 2024-07-02 DIAGNOSIS — B351 Tinea unguium: Secondary | ICD-10-CM

## 2024-07-02 NOTE — Progress Notes (Signed)
  Subjective:  Patient ID: Stephen Alvarez, male    DOB: 1931/09/27,  MRN: [id number]  89 y.o. male presents painful mycotic toenails of both feet that are difficult to trim. Pain interferes with daily activities and wearing enclosed shoe gear comfortably. His son is present during today's visit. They voice no new pedal concerns on today's visit. Chief Complaint  Patient presents with   Toe Pain    Dr. Auston is his PCP. Last visit was in August. Denies being diabetic   New problem(s): None   PCP is Stephen Reyes BIRCH, MD.  No Known Allergies  Review of Systems: Negative except as noted in the HPI.   Objective:  Stephen Alvarez is a pleasant 89 y.o. male in NAD. AAO x 3.  Vascular Examination: Vascular status intact b/l with palpable pedal pulses. CFT immediate b/l. Pedal hair present. No edema. No pain with calf compression b/l. Skin temperature gradient WNL b/l. No varicosities noted. No cyanosis or clubbing noted.  Neurological Examination: Sensation grossly intact b/l with 10 gram monofilament. Vibratory sensation intact b/l.  Dermatological Examination: Pedal skin with normal turgor, texture and tone b/l. No open wounds nor interdigital macerations noted. Toenails 1-5 b/l thick, discolored, elongated with subungual debris and pain on dorsal palpation. No corns, calluses nor porokeratotic lesions noted.  Musculoskeletal Examination: Muscle strength 5/5 to b/l LE.  No pain, crepitus noted b/l. No gross pedal deformities. Patient ambulates independently without assistive aids.   Radiographs: None   Assessment:   1. Pain due to onychomycosis of toenail     Plan:  Consent given for treatment. Patient examined. All patient's and/or POA's questions/concerns addressed on today's visit. Toenails 1-5 b/l debrided in length and girth without incident. Continue soft, supportive shoe gear daily. Report any pedal injuries to medical professional. Call office if there are any  questions/concerns. -Patient/POA to call should there be question/concern in the interim.  Return in about 3 months (around 10/02/2024).  Delon LITTIE Merlin, DPM      [place] LOCATION: [address], [place] [address]                   Office [phone]   [facility] LOCATION: [address] [place], [place] [address] Office [phone]

## 2024-10-15 ENCOUNTER — Ambulatory Visit: Admitting: Podiatry

## 2024-10-25 ENCOUNTER — Ambulatory Visit: Admitting: Podiatry
